# Patient Record
Sex: Female | Born: 1985 | Race: Black or African American | Hispanic: No | Marital: Single | Smoking: Never smoker
Health system: Southern US, Community
[De-identification: ages and names within clinical notes are randomized; demographics above are authoritative.]

## PROBLEM LIST (undated history)

## (undated) DIAGNOSIS — R002 Palpitations: Secondary | ICD-10-CM

## (undated) DIAGNOSIS — O24419 Gestational diabetes mellitus in pregnancy, unspecified control: Secondary | ICD-10-CM

## (undated) DIAGNOSIS — N83209 Unspecified ovarian cyst, unspecified side: Secondary | ICD-10-CM

## (undated) DIAGNOSIS — B9689 Other specified bacterial agents as the cause of diseases classified elsewhere: Secondary | ICD-10-CM

## (undated) DIAGNOSIS — N760 Acute vaginitis: Secondary | ICD-10-CM

## (undated) DIAGNOSIS — Z3490 Encounter for supervision of normal pregnancy, unspecified, unspecified trimester: Secondary | ICD-10-CM

## (undated) DIAGNOSIS — R87629 Unspecified abnormal cytological findings in specimens from vagina: Secondary | ICD-10-CM

## (undated) DIAGNOSIS — Z8619 Personal history of other infectious and parasitic diseases: Secondary | ICD-10-CM

## (undated) HISTORY — DX: Acute vaginitis: N76.0

## (undated) HISTORY — DX: Unspecified abnormal cytological findings in specimens from vagina: R87.629

## (undated) HISTORY — DX: Personal history of other infectious and parasitic diseases: Z86.19

## (undated) HISTORY — PX: APPENDECTOMY: SHX54

## (undated) HISTORY — DX: Unspecified ovarian cyst, unspecified side: N83.209

## (undated) HISTORY — DX: Other specified bacterial agents as the cause of diseases classified elsewhere: B96.89

## (undated) HISTORY — DX: Encounter for supervision of normal pregnancy, unspecified, unspecified trimester: Z34.90

## (undated) HISTORY — DX: Palpitations: R00.2

---

## 2007-04-17 ENCOUNTER — Emergency Department (HOSPITAL_COMMUNITY): Admission: EM | Admit: 2007-04-17 | Discharge: 2007-04-17 | Payer: Self-pay | Admitting: Emergency Medicine

## 2009-12-18 ENCOUNTER — Emergency Department (HOSPITAL_COMMUNITY): Admission: EM | Admit: 2009-12-18 | Discharge: 2009-12-18 | Payer: Self-pay | Admitting: Emergency Medicine

## 2010-02-25 ENCOUNTER — Encounter: Admission: RE | Admit: 2010-02-25 | Discharge: 2010-02-25 | Payer: Self-pay | Admitting: Obstetrics & Gynecology

## 2010-03-14 ENCOUNTER — Other Ambulatory Visit: Payer: Self-pay

## 2010-03-14 ENCOUNTER — Ambulatory Visit: Payer: Self-pay | Admitting: Advanced Practice Midwife

## 2010-03-14 ENCOUNTER — Other Ambulatory Visit: Payer: Self-pay | Admitting: Emergency Medicine

## 2010-03-14 ENCOUNTER — Inpatient Hospital Stay (HOSPITAL_COMMUNITY): Admission: AD | Admit: 2010-03-14 | Discharge: 2010-03-14 | Payer: Self-pay | Admitting: Obstetrics & Gynecology

## 2010-03-28 ENCOUNTER — Ambulatory Visit: Payer: Self-pay | Admitting: Obstetrics & Gynecology

## 2010-03-28 ENCOUNTER — Inpatient Hospital Stay (HOSPITAL_COMMUNITY): Admission: AD | Admit: 2010-03-28 | Discharge: 2010-04-01 | Payer: Self-pay | Admitting: Obstetrics & Gynecology

## 2010-03-29 ENCOUNTER — Encounter: Payer: Self-pay | Admitting: Obstetrics & Gynecology

## 2010-07-21 ENCOUNTER — Other Ambulatory Visit: Admission: RE | Admit: 2010-07-21 | Discharge: 2010-07-21 | Payer: Self-pay | Admitting: Obstetrics and Gynecology

## 2010-12-13 LAB — DIFFERENTIAL
Basophils Absolute: 0 10*3/uL (ref 0.0–0.1)
Basophils Relative: 0 % (ref 0–1)
Eosinophils Absolute: 0.1 10*3/uL (ref 0.0–0.7)
Monocytes Absolute: 0.7 10*3/uL (ref 0.1–1.0)
Neutro Abs: 4.8 10*3/uL (ref 1.7–7.7)
Neutrophils Relative %: 67 % (ref 43–77)

## 2010-12-13 LAB — URINALYSIS, ROUTINE W REFLEX MICROSCOPIC
Glucose, UA: NEGATIVE mg/dL
Specific Gravity, Urine: 1.022 (ref 1.005–1.030)
pH: 6 (ref 5.0–8.0)

## 2010-12-13 LAB — CBC
HCT: 35.7 % — ABNORMAL LOW (ref 36.0–46.0)
Hemoglobin: 9.7 g/dL — ABNORMAL LOW (ref 12.0–15.0)
MCHC: 34.7 g/dL (ref 30.0–36.0)
MCV: 87.3 fL (ref 78.0–100.0)
MCV: 87.6 fL (ref 78.0–100.0)
RBC: 3.12 MIL/uL — ABNORMAL LOW (ref 3.87–5.11)
RBC: 4.09 MIL/uL (ref 3.87–5.11)
RDW: 15.1 % (ref 11.5–15.5)
RDW: 15 % (ref 11.5–15.5)
WBC: 6.8 10*3/uL (ref 4.0–10.5)
WBC: 9.4 10*3/uL (ref 4.0–10.5)

## 2010-12-13 LAB — GLUCOSE, CAPILLARY: Glucose-Capillary: 127 mg/dL — ABNORMAL HIGH (ref 70–99)

## 2010-12-13 LAB — RPR: RPR Ser Ql: NONREACTIVE

## 2010-12-13 LAB — RUBELLA SCREEN: Rubella: 51.4 IU/mL — ABNORMAL HIGH

## 2010-12-13 LAB — URINE CULTURE

## 2010-12-13 LAB — URINE MICROSCOPIC-ADD ON

## 2011-07-12 LAB — RAPID STREP SCREEN (MED CTR MEBANE ONLY): Streptococcus, Group A Screen (Direct): NEGATIVE

## 2011-07-26 ENCOUNTER — Other Ambulatory Visit (HOSPITAL_COMMUNITY)
Admission: RE | Admit: 2011-07-26 | Discharge: 2011-07-26 | Disposition: A | Discharge status: Home / Self Care | Payer: Medicaid Other | Source: Ambulatory Visit | Attending: Obstetrics and Gynecology | Admitting: Obstetrics and Gynecology

## 2011-07-26 ENCOUNTER — Other Ambulatory Visit: Payer: Self-pay | Admitting: Adult Health

## 2011-07-26 DIAGNOSIS — Z01419 Encounter for gynecological examination (general) (routine) without abnormal findings: Secondary | ICD-10-CM | POA: Insufficient documentation

## 2011-07-26 DIAGNOSIS — Z113 Encounter for screening for infections with a predominantly sexual mode of transmission: Secondary | ICD-10-CM | POA: Insufficient documentation

## 2011-09-28 NOTE — L&D Delivery Note (Signed)
I have seen and examined this patient and I agree with the above. Abisai Coble 12:25 AM 09/02/2012    

## 2011-09-28 NOTE — L&D Delivery Note (Signed)
Delivery Note At 5:49 AM a viable female was delivered via Vaginal, Spontaneous Delivery (Presentation: ; Occiput Anterior).  APGAR: , 9; weight .   Placenta status: Intact, Spontaneous.  Cord: 3 vessels with the following complications:None  Anesthesia: Epidural  Episiotomy: None Est. Blood Loss (mL): 350  Mom to postpartum.  Baby to NICU.  Governor Specking 08/30/2012, 6:16 AM

## 2012-03-14 LAB — OB RESULTS CONSOLE ANTIBODY SCREEN: Antibody Screen: NEGATIVE

## 2012-03-14 LAB — OB RESULTS CONSOLE RPR: RPR: NONREACTIVE

## 2012-06-07 ENCOUNTER — Other Ambulatory Visit: Payer: Self-pay | Admitting: Obstetrics and Gynecology

## 2012-06-08 ENCOUNTER — Encounter (HOSPITAL_COMMUNITY): Payer: Self-pay | Admitting: Anesthesiology

## 2012-06-08 ENCOUNTER — Encounter (HOSPITAL_COMMUNITY)
Admission: RE | Disposition: A | Discharge status: Home / Self Care | Payer: Self-pay | Source: Ambulatory Visit | Attending: Obstetrics and Gynecology

## 2012-06-08 ENCOUNTER — Ambulatory Visit (HOSPITAL_COMMUNITY)
Admission: RE | Admit: 2012-06-08 | Discharge: 2012-06-08 | Disposition: A | Discharge status: Home / Self Care | Payer: Medicaid Other | Source: Ambulatory Visit | Attending: Obstetrics and Gynecology | Admitting: Obstetrics and Gynecology

## 2012-06-08 ENCOUNTER — Ambulatory Visit (HOSPITAL_COMMUNITY): Payer: Medicaid Other | Admitting: Anesthesiology

## 2012-06-08 ENCOUNTER — Encounter (HOSPITAL_COMMUNITY): Payer: Self-pay | Admitting: *Deleted

## 2012-06-08 ENCOUNTER — Encounter (HOSPITAL_COMMUNITY): Payer: Self-pay | Admitting: Obstetrics and Gynecology

## 2012-06-08 DIAGNOSIS — O3430 Maternal care for cervical incompetence, unspecified trimester: Secondary | ICD-10-CM | POA: Diagnosis present

## 2012-06-08 HISTORY — PX: CERVICAL CERCLAGE: SHX1329

## 2012-06-08 LAB — CBC
HCT: 34.3 % — ABNORMAL LOW (ref 36.0–46.0)
Hemoglobin: 11.7 g/dL — ABNORMAL LOW (ref 12.0–15.0)
MCHC: 34.1 g/dL (ref 30.0–36.0)
MCV: 87.5 fL (ref 78.0–100.0)
RDW: 12.9 % (ref 11.5–15.5)

## 2012-06-08 SURGERY — CERCLAGE, CERVIX, VAGINAL APPROACH
Anesthesia: Spinal | Site: Cervix | Wound class: Clean Contaminated

## 2012-06-08 MED ORDER — BUPIVACAINE IN DEXTROSE 0.75-8.25 % IT SOLN
INTRATHECAL | Status: DC | PRN
  Administered 2012-06-08: 1.4 mL via INTRATHECAL

## 2012-06-08 MED ORDER — LACTATED RINGERS IV SOLN
INTRAVENOUS | Status: DC
  Administered 2012-06-08: 13:00:00 via INTRAVENOUS

## 2012-06-08 MED ORDER — MEPERIDINE HCL 25 MG/ML IJ SOLN: 6.2500 mg | INTRAMUSCULAR | Status: DC | PRN

## 2012-06-08 MED ORDER — ONDANSETRON HCL 4 MG/2ML IJ SOLN
INTRAMUSCULAR | Status: DC | PRN
  Administered 2012-06-08: 4 mg via INTRAVENOUS

## 2012-06-08 MED ORDER — FENTANYL CITRATE 0.05 MG/ML IJ SOLN
INTRAMUSCULAR | Status: AC
  Filled 2012-06-08: qty 2

## 2012-06-08 MED ORDER — ONDANSETRON HCL 4 MG/2ML IJ SOLN: 4.0000 mg | Freq: Once | INTRAMUSCULAR | Status: DC | PRN

## 2012-06-08 MED ORDER — KETOROLAC TROMETHAMINE 30 MG/ML IJ SOLN
15.0000 mg | Freq: Once | INTRAMUSCULAR | Status: AC | PRN
  Administered 2012-06-08: 30 mg via INTRAVENOUS

## 2012-06-08 MED ORDER — FENTANYL CITRATE 0.05 MG/ML IJ SOLN: 25.0000 ug | INTRAMUSCULAR | Status: DC | PRN

## 2012-06-08 MED ORDER — FENTANYL CITRATE 0.05 MG/ML IJ SOLN
INTRAMUSCULAR | Status: DC | PRN
  Administered 2012-06-08 (×2): 50 ug via INTRAVENOUS

## 2012-06-08 MED ORDER — CEFAZOLIN SODIUM-DEXTROSE 2-3 GM-% IV SOLR
2.0000 g | INTRAVENOUS | Status: AC
  Administered 2012-06-08: 2 g via INTRAVENOUS

## 2012-06-08 MED ORDER — ONDANSETRON HCL 4 MG/2ML IJ SOLN
INTRAMUSCULAR | Status: AC
  Filled 2012-06-08: qty 2

## 2012-06-08 MED ORDER — NIFEDIPINE 10 MG PO CAPS
10.0000 mg | ORAL_CAPSULE | Freq: Once | ORAL | Status: AC
  Administered 2012-06-08: 10 mg via ORAL
  Filled 2012-06-08: qty 1

## 2012-06-08 MED ORDER — CEFAZOLIN SODIUM-DEXTROSE 2-3 GM-% IV SOLR
INTRAVENOUS | Status: AC
  Filled 2012-06-08: qty 50

## 2012-06-08 SURGICAL SUPPLY — 18 items
CATH ROBINSON RED A/P 16FR (CATHETERS) ×2 IMPLANT
CLOTH BEACON ORANGE TIMEOUT ST (SAFETY) ×2 IMPLANT
COUNTER NEEDLE 1200 MAGNETIC (NEEDLE) ×2 IMPLANT
GLOVE BIO SURGEON ST LM GN SZ9 (GLOVE) ×2 IMPLANT
GLOVE BIO SURGEON STRL SZ7 (GLOVE) ×2 IMPLANT
GLOVE BIOGEL PI IND STRL 9 (GLOVE) ×2 IMPLANT
GLOVE BIOGEL PI INDICATOR 9 (GLOVE) ×2
GOWN PREVENTION PLUS LG XLONG (DISPOSABLE) ×2 IMPLANT
GOWN PREVENTION PLUS XLARGE (GOWN DISPOSABLE) ×2 IMPLANT
GOWN STRL REIN 3XL LVL4 (GOWN DISPOSABLE) ×2 IMPLANT
PACK VAGINAL MINOR WOMEN LF (CUSTOM PROCEDURE TRAY) ×2 IMPLANT
PAD OB MATERNITY 4.3X12.25 (PERSONAL CARE ITEMS) ×2 IMPLANT
PAD PREP 24X48 CUFFED NSTRL (MISCELLANEOUS) ×2 IMPLANT
SUT PROLENE 1 CT 1 30 (SUTURE) ×2 IMPLANT
TOWEL OR 17X24 6PK STRL BLUE (TOWEL DISPOSABLE) ×4 IMPLANT
TUBING NON-CON 1/4 X 20 CONN (TUBING) ×2 IMPLANT
WATER STERILE IRR 1000ML POUR (IV SOLUTION) ×2 IMPLANT
YANKAUER SUCT BULB TIP NO VENT (SUCTIONS) ×2 IMPLANT

## 2012-06-08 NOTE — Op Note (Signed)
See the operative details included in the brief Op note

## 2012-06-08 NOTE — Anesthesia Procedure Notes (Signed)
Spinal  Patient location during procedure: OR Start time: 06/08/2012 1:50 PM End time: 06/08/2012 1:55 PM Staffing Anesthesiologist: Sandrea Hughs Performed by: anesthesiologist  Preanesthetic Checklist Completed: patient identified, site marked, surgical consent, pre-op evaluation, timeout performed, IV checked, risks and benefits discussed and monitors and equipment checked Spinal Block Patient position: sitting Prep: DuraPrep Patient monitoring: heart rate, cardiac monitor, continuous pulse ox and blood pressure Approach: midline Location: L3-4 Injection technique: single-shot Needle Needle type: Sprotte  Needle gauge: 24 G Needle length: 9 cm Needle insertion depth: 8 cm Assessment Sensory level: T4

## 2012-06-08 NOTE — Transfer of Care (Signed)
Immediate Anesthesia Transfer of Care Note  Patient: Briana Nichols  Procedure(s) Performed: Procedure(s) (LRB) with comments: CERCLAGE CERVICAL (N/A)  Patient Location: PACU  Anesthesia Type: Spinal  Level of Consciousness: awake, alert  and oriented  Airway & Oxygen Therapy: Patient Spontanous Breathing  Post-op Assessment: Report given to PACU RN and Post -op Vital signs reviewed and stable  Post vital signs: Reviewed and stable  Complications: No apparent anesthesia complications

## 2012-06-08 NOTE — Progress Notes (Signed)
Patient requesting to get up. Patient stood and ambulated with assistance. Walked to bathroom, voided, assisted with getting patient dressed. Walked back to wheelchair with minimal assistance. Patient feeling much better about going home. Reassured.

## 2012-06-08 NOTE — Brief Op Note (Signed)
06/08/2012  2:23 PM  PATIENT:  Briana Nichols  26 y.o. female  PRE-OPERATIVE DIAGNOSIS:  Cervical Incompetence, pregnancy 21+6 weeks  POST-OPERATIVE DIAGNOSIS:  Cervical Incompetence same  PROCEDURE:  Procedure(s) (LRB) with comments: CERCLAGE CERVICAL (N/A)  SURGEON:  Surgeon(s) and Role:    * Tilda Burrow, MD - Primary  PHYSICIAN ASSISTANT:   ASSISTANTS: none   ANESTHESIA:   spinal  EBL:  Total I/O In: 800 [I.V.:800] Out: -   BLOOD ADMINISTERED:none  DRAINS: none   LOCAL MEDICATIONS USED:  NONE  SPECIMEN:  No Specimen  DISPOSITION OF SPECIMEN:  N/A  COUNTS:  YES  TOURNIQUET:  * No tourniquets in log *  DICTATION: .Dragon Dictation Patient was taken operating room prepped and draped for vaginal procedure after spinal anesthesia introduced. Timeout was confirmed by operative team Ancef 1 g IV was administered. Timeout was followed by placement of a open sided speculum,. Cervix was visibly unchanged from yesterday's exam. And there was some reasonable cervical length protruding into the vagina.. The #1 early in suture was begun at 12:00, and a very superficial circumferential pursestring suture of #1 Prolene was placed in a clockwise fashion around the cervix at the level of its insertion into the vaginal wall. Once circumferential suturing was completed a surgical knot was placed and tied down such that a standard ring forceps could be passed through the cervix with some resistance encountered. There was no bleeding from the endocervix throughout the procedure, only some small amount of bleeding from the suture sites themselves which, which stopped promptly upon completion of procedure the blood type is confirmed as Rh+ by the review of old records. Patient will go recovery room be discharged home stable condition thereafter. PLAN OF CARE: Discharge to home after PACU  PATIENT DISPOSITION:  PACU - hemodynamically stable.   Delay start of Pharmacological VTE agent  (>24hrs) due to surgical blood loss or risk of bleeding: not applicable

## 2012-06-08 NOTE — Progress Notes (Signed)
Patient stood at recliner with assistance, took a few steps. Still feels she is not ready to walk. States " feet still feel numb on the bottom". Patient given coke and peanut butter with crackers. Will reassess later.

## 2012-06-08 NOTE — Anesthesia Preprocedure Evaluation (Signed)
Anesthesia Evaluation  Patient identified by MRN, date of birth, ID band Patient awake    Reviewed: Allergy & Precautions, H&P , NPO status , Patient's Chart, lab work & pertinent test results  Airway Mallampati: II TM Distance: >3 FB Neck ROM: full    Dental No notable dental hx.    Pulmonary neg pulmonary ROS,    Pulmonary exam normal       Cardiovascular negative cardio ROS      Neuro/Psych negative neurological ROS  negative psych ROS   GI/Hepatic negative GI ROS, Neg liver ROS,   Endo/Other  Morbid obesity  Renal/GU negative Renal ROS  negative genitourinary   Musculoskeletal negative musculoskeletal ROS (+)   Abdominal (+) + obese,   Peds negative pediatric ROS (+)  Hematology negative hematology ROS (+)   Anesthesia Other Findings   Reproductive/Obstetrics (+) Pregnancy                           Anesthesia Physical Anesthesia Plan  ASA: III  Anesthesia Plan: Spinal   Post-op Pain Management:    Induction:   Airway Management Planned:   Additional Equipment:   Intra-op Plan:   Post-operative Plan:   Informed Consent: I have reviewed the patients History and Physical, chart, labs and discussed the procedure including the risks, benefits and alternatives for the proposed anesthesia with the patient or authorized representative who has indicated his/her understanding and acceptance.     Plan Discussed with:   Anesthesia Plan Comments:         Anesthesia Quick Evaluation

## 2012-06-08 NOTE — Anesthesia Postprocedure Evaluation (Signed)
  Anesthesia Post-op Note  Patient: Briana Nichols  Procedure(s) Performed: Procedure(s) (LRB) with comments: CERCLAGE CERVICAL (N/A)  Patient Location: PACU  Anesthesia Type: Spinal  Level of Consciousness: awake, alert  and oriented  Airway and Oxygen Therapy: Patient Spontanous Breathing  Post-op Pain: none  Post-op Assessment: Post-op Vital signs reviewed, Patient's Cardiovascular Status Stable, Respiratory Function Stable, Patent Airway, No signs of Nausea or vomiting, Pain level controlled, No headache and No backache  Post-op Vital Signs: Reviewed and stable  Complications: No apparent anesthesia complications

## 2012-06-08 NOTE — Progress Notes (Signed)
Patient in phase II. Patient was spinal. Patient does not feel like she can stand. Reassured patient. Stood at recliner for few minutes but unable to take a step.

## 2012-06-08 NOTE — H&P (Signed)
Briana Nichols is an 26 y.o. female. She is scheduled for McDonald cerclage at 1:30 PM 06/08/2012 due to progression of cervical dilation and funneling despite Prometrium transvaginally 200 mg daily. OB history as of a term pregnancy delivered by cesarean section for failure to progress and 39 weeks at completely dilated. This there is no predisposing factors in history, no cervical therapies and specifically no cryocautery or conization procedures.  Pertinent Gynecology Previous GYN Procedures: Cesarean section low transverse  Last mammogram: Not applicable Date:  Last pap: normal Date: 07/27/2011 OB History: G3, P1011   Menstrual History: Menarche age:  No LMP recorded.    No past medical history on file.  No past surgical history on file. [facility]  No family history on file.  Social History:  does not have a smoking history on file. She does not have any smokeless tobacco history on file. Her alcohol and drug histories not on file.  Allergies: Allergies not on file  No prescriptions prior to admission    Review of Systems  Constitutional: Negative.     There were no vitals taken for this visit. height 5 feet 6 weight 264.8 pounds blood pressure 110/60 urinalysis negative Physical Exam  Constitutional: She appears well-developed and well-nourished.  HENT:  Head: Normocephalic and atraumatic.  Eyes: Pupils are equal, round, and reactive to light.  Neck: Neck supple. No thyromegaly present.  Cardiovascular: Normal rate and regular rhythm.   Respiratory: Effort normal.  GI: Soft.  Genitourinary: Vagina normal.       Transvaginal ultrasound performed on June 07 2012 shows the cervix has changed to 0.4 cm of closed cervical length, with funneling measuring 1.3 cm maximum width, narrowing the 0.5 cm total funneling length 1.8 cm. This is progression of the funneling since last weeks ultrasound where the cervical length was  1.3-1.7 cm on various measurements   speculum exam shows the cervix to be high well supported and visibly normal  No results found for this or any previous visit (from the past 24 hour(s)).  No results found.  Assessment/Plan: Pregnancy 21 weeks 6 days progressive cervical incompetency despite Prometrium therapy, scheduled for McDonald cerclage.. Risks of procedure including potential for amniotic membrane rupture, and technical failure or continued progression of cervical dilation  Puccio,Caryl Fate V 06/08/2012, 9:21 AM

## 2012-06-09 ENCOUNTER — Encounter (HOSPITAL_COMMUNITY): Payer: Self-pay | Admitting: Obstetrics and Gynecology

## 2012-08-18 ENCOUNTER — Inpatient Hospital Stay (HOSPITAL_COMMUNITY)
Admission: RE | Admit: 2012-08-18 | Discharge: 2012-09-01 | DRG: 775 | Disposition: A | Discharge status: Home / Self Care | Payer: Medicaid Other | Source: Ambulatory Visit | Attending: Obstetrics & Gynecology | Admitting: Obstetrics & Gynecology

## 2012-08-18 DIAGNOSIS — O4290 Premature rupture of membranes, unspecified as to length of time between rupture and onset of labor, unspecified weeks of gestation: Secondary | ICD-10-CM | POA: Diagnosis present

## 2012-08-18 DIAGNOSIS — O3430 Maternal care for cervical incompetence, unspecified trimester: Principal | ICD-10-CM | POA: Diagnosis present

## 2012-08-18 DIAGNOSIS — O42913 Preterm premature rupture of membranes, unspecified as to length of time between rupture and onset of labor, third trimester: Secondary | ICD-10-CM | POA: Diagnosis present

## 2012-08-18 HISTORY — DX: Gestational diabetes mellitus in pregnancy, unspecified control: O24.419

## 2012-08-19 ENCOUNTER — Inpatient Hospital Stay (HOSPITAL_COMMUNITY): Payer: Medicaid Other

## 2012-08-19 ENCOUNTER — Encounter (HOSPITAL_COMMUNITY): Payer: Self-pay | Admitting: *Deleted

## 2012-08-19 LAB — PREPARE RBC (CROSSMATCH)

## 2012-08-19 MED ORDER — MAGNESIUM SULFATE 40 G IN LACTATED RINGERS - SIMPLE
2.0000 g/h | INTRAVENOUS | Status: DC
  Administered 2012-08-19: 2 g/h via INTRAVENOUS
  Filled 2012-08-19 (×2): qty 500

## 2012-08-19 MED ORDER — ERYTHROMYCIN BASE 250 MG PO TABS
250.0000 mg | ORAL_TABLET | Freq: Four times a day (QID) | ORAL | Status: AC
  Administered 2012-08-21 – 2012-08-25 (×20): 250 mg via ORAL
  Filled 2012-08-19 (×20): qty 1

## 2012-08-19 MED ORDER — PRENATAL MULTIVITAMIN CH
1.0000 | ORAL_TABLET | Freq: Every day | ORAL | Status: DC
  Administered 2012-08-19 – 2012-08-29 (×11): 1 via ORAL
  Filled 2012-08-19 (×11): qty 1

## 2012-08-19 MED ORDER — SODIUM CHLORIDE 0.9 % IV SOLN
250.0000 mg | Freq: Four times a day (QID) | INTRAVENOUS | Status: AC
  Administered 2012-08-19 – 2012-08-20 (×8): 250 mg via INTRAVENOUS
  Filled 2012-08-19 (×8): qty 250

## 2012-08-19 MED ORDER — CALCIUM CARBONATE ANTACID 500 MG PO CHEW
2.0000 | CHEWABLE_TABLET | ORAL | Status: DC | PRN
  Administered 2012-08-22: 400 mg via ORAL
  Filled 2012-08-19: qty 2

## 2012-08-19 MED ORDER — SODIUM CHLORIDE 0.9 % IV SOLN
2.0000 g | Freq: Four times a day (QID) | INTRAVENOUS | Status: AC
  Administered 2012-08-19 – 2012-08-20 (×8): 2 g via INTRAVENOUS
  Filled 2012-08-19 (×8): qty 2000

## 2012-08-19 MED ORDER — DOCUSATE SODIUM 100 MG PO CAPS
100.0000 mg | ORAL_CAPSULE | Freq: Every day | ORAL | Status: DC
  Administered 2012-08-19 – 2012-08-29 (×11): 100 mg via ORAL
  Filled 2012-08-19 (×11): qty 1

## 2012-08-19 MED ORDER — ZOLPIDEM TARTRATE 5 MG PO TABS
5.0000 mg | ORAL_TABLET | Freq: Every evening | ORAL | Status: DC | PRN
  Administered 2012-08-24 – 2012-08-29 (×7): 5 mg via ORAL
  Filled 2012-08-19 (×7): qty 1

## 2012-08-19 MED ORDER — LACTATED RINGERS IV SOLN
INTRAVENOUS | Status: DC
  Administered 2012-08-19 – 2012-08-20 (×2): via INTRAVENOUS

## 2012-08-19 MED ORDER — ACETAMINOPHEN 325 MG PO TABS
650.0000 mg | ORAL_TABLET | ORAL | Status: DC | PRN
  Administered 2012-08-29: 650 mg via ORAL
  Filled 2012-08-19: qty 2

## 2012-08-19 MED ORDER — AMOXICILLIN 500 MG PO CAPS
500.0000 mg | ORAL_CAPSULE | Freq: Three times a day (TID) | ORAL | Status: AC
  Administered 2012-08-21 – 2012-08-25 (×15): 500 mg via ORAL
  Filled 2012-08-19 (×15): qty 1

## 2012-08-19 MED ORDER — BETAMETHASONE SOD PHOS & ACET 6 (3-3) MG/ML IJ SUSP
12.0000 mg | Freq: Once | INTRAMUSCULAR | Status: AC
  Administered 2012-08-19: 12 mg via INTRAMUSCULAR
  Filled 2012-08-19: qty 2

## 2012-08-19 NOTE — H&P (Signed)
Attestation of Attending Supervision of Advanced Practitioner (CNM/NP): Evaluation and management procedures were performed by the Advanced Practitioner under my supervision and collaboration.  I have reviewed the Advanced Practitioner's note and chart, and I agree with the management and plan.  Patient has labs pending at [facility]; CBC, UA, U C/S, GC/Chlam. No record of GBS being done.  If she is undelivered after her latency antibiotics, she needs a GBS PCR or she will need to be treated with antibiotics if she goes into labor. OB Limited Ultrasound here showed AFI 13.39, cephalic presentation. Cervix 3.18 cm, appears closed.  Will schedule growth scan on 08/10/12.  Continue routine antenatal care.  Jaynie Collins, MD, FACOG Attending Obstetrician & Gynecologist Faculty Practice, [facility]

## 2012-08-19 NOTE — H&P (Signed)
Briana Nichols is a 26 y.o. female presenting for rupture of membranes. Pt was taken via EMS to a hospital in [place], [place]. She had a +amnisure. Ultrasound there showed the baby to be vertex. No other ultrasound evaluation done. Pt was started on magnesium and give 4 gram load and 2 grams/hour. She was given 1 dose of BMZ, and Ampicillin and Zithromax. ER doctor consulted with Dr. Macon Large, and patient was brought here via EMS. On arrival she is complaining of intermittent LLQ pain.  Maternal Medical History:  Reason for admission: Reason for admission: rupture of membranes.  Reason for Admission:   nauseaFetal activity: Perceived fetal activity is normal.   Last perceived fetal movement was within the past hour.      OB History    Grav Para Term Preterm Abortions TAB SAB Ect Mult Living   4 1   1  1         No past medical history on file. Past Surgical History  Procedure Date  . Cesarean section 2011  . Cervical cerclage 06/08/2012    Procedure: CERCLAGE CERVICAL;  Surgeon: Tilda Burrow, MD;  Location: [facility];  Service: Gynecology;  Laterality: N/A;   Family History: family history is not on file. Social History:  reports that she has never smoked. She has never used smokeless tobacco. She reports that she does not drink alcohol or use illicit drugs.   Prenatal Transfer Tool  Maternal Diabetes: No Genetic Screening: Normal Maternal Ultrasounds/Referrals: Normal fetal ultrasound. Shortened cervical length. Last cervical length 1/2 Cervix was 7mm Fetal Ultrasounds or other Referrals:  None Maternal Substance Abuse:  No Significant Maternal Medications:  None Significant Maternal Lab Results:  None Other Comments:  None  Review of Systems  Constitutional: Negative for fever and chills.  Eyes: Negative for blurred vision.  Gastrointestinal: Positive for abdominal pain (LLQ pain). Negative for nausea, vomiting, diarrhea and constipation.  Genitourinary: Negative for dysuria,  urgency and frequency.  Musculoskeletal: Negative for myalgias.  Neurological: Negative for headaches.      There were no vitals taken for this visit. Maternal Exam:  Uterine Assessment: Contraction strength is mild.  Contraction duration is 30 seconds. Contraction frequency is irregular.   Abdomen: Surgical scars: low transverse.   Fetal presentation: vertex  Introitus: not evaluated.     Fetal Exam Fetal Monitor Review: Mode: ultrasound.   Baseline rate: 140.  Variability: moderate (6-25 bpm).   Pattern: accelerations present.    Fetal State Assessment: Category I - tracings are normal.     Physical Exam  Nursing note and vitals reviewed. Constitutional: She is oriented to person, place, and time. She appears well-developed and well-nourished.  Cardiovascular: Normal rate.   GI: Soft. There is no tenderness.       Abdomen is very soft, and the irregular contractions that she has palpate very mild.   Neurological: She is alert and oriented to person, place, and time.  Skin: Skin is warm and dry.    Prenatal labs: ABO, Rh:  A positive Antibody:  Negative Rubella:  Immune RPR:   NR HBsAg:   Negative HIV:   Negative GBS:   Negative  Assessment/Plan: PPROM at 33 2/7 Reassuring M-F status Expectant management BMZ Ampicillin, Erythro Magnesium   Tawnya Crook 08/19/2012, 12:25 AM

## 2012-08-20 MED ORDER — LACTATED RINGERS IV SOLN: INTRAVENOUS | Status: DC

## 2012-08-20 NOTE — Plan of Care (Signed)
Problem: Consults Goal: Birthing Suites Patient Information Press F2 to bring up selections list   Pt < [redacted] weeks EGA     

## 2012-08-20 NOTE — Progress Notes (Signed)
IV restarted in left hand with 18 g cath. IV  Was located in left antecubital space - "occluded". Pt. Unable to keep arm straight, IV pump alarm every 15 minutes for the past hour with "pt. Side occluded". Pt. In agreement with restarting IV.

## 2012-08-20 NOTE — Progress Notes (Signed)
0730- magnesium sulfate dc'd.

## 2012-08-20 NOTE — Progress Notes (Signed)
Patient ID: Briana Nichols, female   DOB: March 06, 1986, 26 y.o.   MRN: [id number] FACULTY PRACTICE ANTEPARTUM(COMPREHENSIVE) NOTE  Dela Sweeny is a 26 y.o. G3P1011 at [redacted]w[redacted]d by best clinical estimate who is admitted for PROM.   Fetal presentation is cephalic. Length of Stay:  2  Days  Subjective: No contractions Patient reports the fetal movement as active. Patient reports uterine contraction  activity as none. Patient reports  vaginal bleeding as none. Patient describes fluid per vagina as Clear.  Vitals:  Blood pressure 106/51, pulse 99, temperature 98.2 F (36.8 C), temperature source Oral, resp. rate 20, height 5\' 6"  (1.676 m), weight 265 lb (120.203 kg). Physical Examination:  General appearance - alert, well appearing, and in no distress Heart - normal rate and regular rhythm Abdomen - soft, nontender, nondistended Fundal Height:  size equals dates Cervical Exam: Not evaluated.  Extremities: extremities normal, atraumatic, no cyanosis or edema and Homans sign is negative, no sign of DVT with DTRs 2+ bilaterally Membranes:ruptured  Fetal Monitoring:  Baseline: 140 bpm, Variability: Good {> 6 bpm) and Accelerations: Reactive  Labs:  Recent Results (from the past 24 hour(s))  TYPE AND SCREEN   Collection Time   08/19/12  8:09 AM      Component Value Range   ABO/RH(D) A POS     Antibody Screen NEG     Sample Expiration 08/22/2012     Unit Number [id number]     Blood Component Type RED CELLS,LR     Unit division 00     Status of Unit ALLOCATED     Transfusion Status OK TO TRANSFUSE     Crossmatch Result Compatible     Unit Number [id number]     Blood Component Type RED CELLS,LR     Unit division 00     Status of Unit ALLOCATED     Transfusion Status OK TO TRANSFUSE     Crossmatch Result Compatible    ABO/RH   Collection Time   08/19/12  8:11 AM      Component Value Range   ABO/RH(D) A POS     CBC    Component Value Date/Time   WBC 7.3 06/08/2012 1238   RBC 3.92 06/08/2012 1238   HGB 11.7* 06/08/2012 1238   HCT 34.3* 06/08/2012 1238   PLT 153 06/08/2012 1238   MCV 87.5 06/08/2012 1238   MCH 29.8 06/08/2012 1238   MCHC 34.1 06/08/2012 1238   RDW 12.9 06/08/2012 1238   LYMPHSABS 1.6 03/14/2010 1725   MONOABS 0.7 03/14/2010 1725   EOSABS 0.1 03/14/2010 1725   BASOSABS 0.0 03/14/2010 1725      Imaging Studies:    *RADIOLOGY REPORT*  Clinical Data: Cervical insufficiency. Cerclage. Premature  rupture of membranes. Assigned gestational age is 32 weeks 2 days.  LIMITED OBSTETRIC ULTRASOUND  Number of Fetuses: 1  Heart Rate: 130 to bpm  Movement: Fetal movement is identified.  Presentation: Fetus is in cephalic presentation.  Placental Location: The placenta is posterior.  Previa: No previa.  Amniotic Fluid (Subjective): Normal.  Vertical pocket: 5.74cm AFI: 13.39 cm (5%ile 8.6 cm, 95%ile  24.2 cm)  BPD: 8.27cm, 32 weeks 2 days EDC: 10/05/2012  MATERNAL FINDINGS:  Cervix: Cervical length measures 3.18 cm but is not well  visualized. Cervix appears closed.  Uterus/Adnexae: Limited visualization. No discrete abnormality  identified.  IMPRESSION:  Single intrauterine pregnancy. No specific complication  identified. Cervix appears closed and amniotic fluid volume  appears normal.  Recommend followup with non-emergent complete  OB 14+ wk US  examination for fetal biometric evaluation and anatomic survey if  not already performed.  Original Report Authenticated By: Burman Nieves, M.D.    Medications:  Scheduled    . ampicillin (OMNIPEN) IV  2 g Intravenous Q6H   Followed by  . amoxicillin  500 mg Oral Q8H  . [COMPLETED] betamethasone acetate-betamethasone sodium phosphate  12 mg Intramuscular Once  . docusate sodium  100 mg Oral Daily  . erythromycin  250 mg Intravenous Q6H   Followed by  . erythromycin  250 mg Oral Q6H  . prenatal multivitamin  1 tablet Oral Daily   I have reviewed the patient's current  medications.  ASSESSMENT: Patient Active Problem List  Diagnosis  . Cervical incompetence affecting management of pregnancy, antepartum  Preterm rupture of membranes  PLAN: D/C Magnesium.  Mariateresa Batra 08/20/2012,6:30 AM

## 2012-08-21 ENCOUNTER — Inpatient Hospital Stay (HOSPITAL_COMMUNITY): Payer: Medicaid Other

## 2012-08-21 DIAGNOSIS — O3430 Maternal care for cervical incompetence, unspecified trimester: Secondary | ICD-10-CM

## 2012-08-21 DIAGNOSIS — O4290 Premature rupture of membranes, unspecified as to length of time between rupture and onset of labor, unspecified weeks of gestation: Secondary | ICD-10-CM

## 2012-08-21 NOTE — Progress Notes (Signed)
Ur chart review completed.  

## 2012-08-21 NOTE — Progress Notes (Signed)
Patient ID: Briana Nichols, female   DOB: 1986-06-10, 26 y.o.   MRN: [id number] Patient ID: Briana Nichols, female DOB: [birth year], 26 y.o. MRN: [id number]  FACULTY PRACTICE ANTEPARTUM(COMPREHENSIVE) NOTE  Briana Nichols is a 26 y.o. G3P1011 at [redacted]w[redacted]d by best clinical estimate who is admitted for PROM.  Fetal presentation is cephalic.  Length of Stay: 2 Days  Subjective:  No contractions  Patient reports the fetal movement as active.  Patient reports uterine contraction activity as none.  Patient reports vaginal bleeding as none.  Patient describes fluid per vagina as Clear.  Vitals: Blood pressure 106/51, pulse 99, temperature 98.2 F (36.8 C), temperature source Oral, resp. rate 20, height 5\' 6"  (1.676 m), weight 265 lb (120.203 kg).  Physical Examination:  General appearance - alert, well appearing, and in no distress  Heart - normal rate and regular rhythm  Abdomen - soft, nontender, nondistended  Fundal Height: size equals dates  Cervical Exam: Not evaluated.  Extremities: extremities normal, atraumatic, no cyanosis or edema and Homans sign is negative, no sign of DVT with DTRs 2+ bilaterally  Membranes:ruptured  Fetal Monitoring: Baseline: 140 bpm, Variability: Good {> 6 bpm) and Accelerations: Reactive  Labs:  Recent Results (from the past 24 hour(s))   TYPE AND SCREEN    Collection Time    08/19/12 8:09 AM   Component  Value  Range    ABO/RH(D)  A POS     Antibody Screen  NEG     Sample Expiration  08/22/2012     Unit Number  [id number]     Blood Component Type  RED CELLS,LR     Unit division  00     Status of Unit  ALLOCATED     Transfusion Status  OK TO TRANSFUSE     Crossmatch Result  Compatible     Unit Number  [id number]     Blood Component Type  RED CELLS,LR     Unit division  00     Status of Unit  ALLOCATED     Transfusion Status  OK TO TRANSFUSE     Crossmatch Result  Compatible    ABO/RH    Collection Time    08/19/12 8:11 AM   Component  Value   Range    ABO/RH(D)  A POS    CBC    Component  Value  Date/Time    WBC  7.3  06/08/2012 1238    RBC  3.92  06/08/2012 1238    HGB  11.7*  06/08/2012 1238    HCT  34.3*  06/08/2012 1238    PLT  153  06/08/2012 1238    MCV  87.5  06/08/2012 1238    MCH  29.8  06/08/2012 1238    MCHC  34.1  06/08/2012 1238    RDW  12.9  06/08/2012 1238    LYMPHSABS  1.6  03/14/2010 1725    MONOABS  0.7  03/14/2010 1725    EOSABS  0.1  03/14/2010 1725    BASOSABS  0.0  03/14/2010 1725   Imaging Studies:  *RADIOLOGY REPORT*  Clinical Data: Cervical insufficiency. Cerclage. Premature  rupture of membranes. Assigned gestational age is 32 weeks 2 days.  LIMITED OBSTETRIC ULTRASOUND  Number of Fetuses: 1  Heart Rate: 130 to bpm  Movement: Fetal movement is identified.  Presentation: Fetus is in cephalic presentation.  Placental Location: The placenta is posterior.  Previa: No previa.  Amniotic Fluid (Subjective): Normal.  Vertical pocket: 5.74cm AFI: 13.39 cm (  5%ile 8.6 cm, 95%ile  24.2 cm)  BPD: 8.27cm, 32 weeks 2 days EDC: 10/05/2012  MATERNAL FINDINGS:  Cervix: Cervical length measures 3.18 cm but is not well  visualized. Cervix appears closed.  Uterus/Adnexae: Limited visualization. No discrete abnormality  identified.  IMPRESSION:  Single intrauterine pregnancy. No specific complication  identified. Cervix appears closed and amniotic fluid volume  appears normal.  Recommend followup with non-emergent complete OB 14+ wk US  examination for fetal biometric evaluation and anatomic survey if  not already performed.  Original Report Authenticated By: Burman Nieves, M.D.  Medications: Scheduled  .  ampicillin (OMNIPEN) IV  2 g  Intravenous  Q6H    Followed by   .  amoxicillin  500 mg  Oral  Q8H   .  [COMPLETED] betamethasone acetate-betamethasone sodium phosphate  12 mg  Intramuscular  Once   .  docusate sodium  100 mg  Oral  Daily   .  erythromycin  250 mg  Intravenous  Q6H    Followed by   .   erythromycin  250 mg  Oral  Q6H   .  prenatal multivitamin  1 tablet  Oral  Daily   I have reviewed the patient's current medications.  ASSESSMENT:  Patient Active Problem List   Diagnosis   .  Cervical incompetence affecting management of pregnancy, antepartum   Preterm rupture of membranes  PLAN: Delivery at 34 weeks or for s/sx of chorioamnionitis

## 2012-08-22 DIAGNOSIS — O42913 Preterm premature rupture of membranes, unspecified as to length of time between rupture and onset of labor, third trimester: Secondary | ICD-10-CM | POA: Diagnosis present

## 2012-08-22 LAB — TYPE AND SCREEN: Antibody Screen: NEGATIVE

## 2012-08-22 LAB — CBC
HCT: 32.3 % — ABNORMAL LOW (ref 36.0–46.0)
Hemoglobin: 10.8 g/dL — ABNORMAL LOW (ref 12.0–15.0)
RBC: 3.73 MIL/uL — ABNORMAL LOW (ref 3.87–5.11)
WBC: 9.6 10*3/uL (ref 4.0–10.5)

## 2012-08-22 MED ORDER — SODIUM CHLORIDE 0.9 % IJ SOLN
3.0000 mL | Freq: Two times a day (BID) | INTRAMUSCULAR | Status: DC
  Administered 2012-08-22 – 2012-08-23 (×5): 3 mL via INTRAVENOUS

## 2012-08-22 NOTE — Consult Note (Signed)
Antenatal Consult:  Asked by Dr Macon Large to speak to Mrs Boudreau to discuss outcome of preterm delivery at 2 5/[redacted] weeks gestation. Chart reviewed. Briefly, she is a 26 y/o G3P1 admitted since 11/22 for PPROM, with rare contractions. Fetal US shows normal fetal growth, low-normal AFI.  She received a course of betamethasone, was on magnesium sulfate and started on Ampicillin, Erythromycin and Zithromax.   EFW about 4 lbs per latest fetal sonogram.   I spoke to Mrs Marston in Room 156 and discussed varied preterm outcomes in an infant born between 32 weeks and close to [redacted] weeks gestation. Discussed complications of of earlier gestation such as RDS, IVH, sepsis, immaturity of GI tract and immune system. Discussed treatment and respiratory support as well as temperature support, gavage feeding, and LOS. I also discussed benefits of breastfeeding. Mrs Hagens plans to breast feed and is quite interested in doing skin to skin.  She asked appropriate questions which I answered.    Thank you for this consult.     Overton Mam, MD (Attending Neonatologist)   Consult time:   30 minutes

## 2012-08-22 NOTE — Progress Notes (Signed)
  FACULTY PRACTICE ANTEPARTUM(COMPREHENSIVE) NOTE  Briana Nichols is a 26 y.o. G3P1011 at [redacted]w[redacted]d by best clinical estimate who is admitted for PROM.  Fetal presentation is cephalic.  Length of Stay: 2 Days  Subjective:  No contractions  Patient reports the fetal movement as active.  Patient reports uterine contraction activity as none.  Patient reports vaginal bleeding as none.  Patient describes fluid per vagina as Clear.  Vitals: Blood pressure 114/59, pulse 89, temperature 98.2 F (36.8 C), temperature source Oral, resp. rate 18, height 5\' 6"  (1.676 m), weight 265 lb (120.203 kg). Physical Examination:  General appearance - alert, well appearing, and in no distress  Heart - normal rate and regular rhythm  Abdomen - soft, nontender, nondistended  Fundal Height: size equals dates  Cervical Exam: Not evaluated.  Extremities: extremities normal, atraumatic, no cyanosis or edema and Homans sign is negative, no sign of DVT with DTRs 2+ bilaterally  Membranes:ruptured  Fetal Monitoring: Baseline: 150 bpm, Variability: Moderate {> 6 bpm) and Accelerations: Reactive. Rare contractions Labs:  No results found for this or any previous visit (from the past 24 hour(s)).  Imaging Studies:  08/21/12 EFW 1940g/51%, AFI 11.29cm, cephalic  08/19/12 LIMITED OBSTETRIC ULTRASOUND  Clinical Data: Cervical insufficiency. Cerclage. Premature rupture of membranes. Assigned gestational age is 32 weeks 2 days. Number of Fetuses: 1  Heart Rate: 130 to bpm  Movement: Fetal movement is identified.  Presentation: Fetus is in cephalic presentation.  Placental Location: The placenta is posterior.  Previa: No previa.  Amniotic Fluid (Subjective): Normal.  Vertical pocket: 5.74cm AFI: 13.39 cm (5%ile 8.6 cm, 95%ile 24.2 cm)  BPD: 8.27cm, 32 weeks 2 days EDC: 10/05/2012  MATERNAL FINDINGS:  Cervix: Cervical length measures 3.18 cm but is not well visualized. Cervix appears closed.  Uterus/Adnexae: Limited  visualization. No discrete abnormality identified.  IMPRESSION:  Single intrauterine pregnancy. No specific complication identified. Cervix appears closed and amniotic fluid volume appears normal.  Medications: Scheduled     . amoxicillin  500 mg Oral Q8H  . docusate sodium  100 mg Oral Daily  . erythromycin  250 mg Oral Q6H  . prenatal multivitamin  1 tablet Oral Daily  . sodium chloride  3 mL Intravenous Q12H   I have reviewed the patient's current medications.   ASSESSMENT:  Patient Active Problem List  Diagnosis  . Cervical incompetence affecting management of pregnancy, antepartum  . Preterm premature rupture of membranes in third trimester   PLAN: Received betamethasone x 2 doses, magnesium sulfate initially Continue latency antibiotics Delivery at 34 weeks or for s/sx of chorioamnionitis CBC and T&S ordered for today; will repeat in 3 days Routine antenatal care

## 2012-08-23 ENCOUNTER — Other Ambulatory Visit (HOSPITAL_COMMUNITY): Payer: Medicaid Other

## 2012-08-23 LAB — TYPE AND SCREEN
ABO/RH(D): A POS
Antibody Screen: NEGATIVE
Unit division: 0

## 2012-08-23 MED ORDER — FAMOTIDINE 20 MG PO TABS
20.0000 mg | ORAL_TABLET | Freq: Two times a day (BID) | ORAL | Status: DC
  Administered 2012-08-23 – 2012-08-29 (×14): 20 mg via ORAL
  Filled 2012-08-23 (×14): qty 1

## 2012-08-23 NOTE — Progress Notes (Signed)
Patient ID: Briana Nichols, female   DOB: [birth year], 26 y.o.   MRN: [id number] FACULTY PRACTICE ANTEPARTUM(COMPREHENSIVE) NOTE  Briana Nichols is a 26 y.o. G3P1011 at [redacted]w[redacted]d by best clinical estimate who is admitted for PROM.   Fetal presentation is cephalic. Length of Stay:  5  Days  Subjective: No contractions. Heartburn  Patient reports the fetal movement as active. Patient reports uterine contraction  activity as none. Patient reports  vaginal bleeding as none. Patient describes fluid per vagina as Clear.  Vitals:  Blood pressure 129/60, pulse 75, temperature 98.4 F (36.9 C), temperature source Oral, resp. rate 20, height 5\' 6"  (1.676 m), weight 265 lb (120.203 kg). Physical Examination:  General appearance - alert, well appearing, and in no distress Heart - normal rate and regular rhythm Abdomen - soft, nontender, nondistended Fundal Height:  size equals dates, not tender Cervical Exam: Not evaluated. and found to be not evaluated/ Extremities: extremities normal, atraumatic, no cyanosis or edema and Homans sign is negative, no sign of DVT with DTRs 2+ bilaterally Membranes:ruptured, clear fluid  Fetal Monitoring:  Baseline: 150 bpm, reactive no decelerations  Labs:  Recent Results (from the past 24 hour(s))  CBC   Collection Time   08/22/12  8:10 AM      Component Value Range   WBC 9.6  4.0 - 10.5 K/uL   RBC 3.73 (*) 3.87 - 5.11 MIL/uL   Hemoglobin 10.8 (*) 12.0 - 15.0 g/dL   HCT 40.9 (*) 81.1 - 91.4 %   MCV 86.6  78.0 - 100.0 fL   MCH 29.0  26.0 - 34.0 pg   MCHC 33.4  30.0 - 36.0 g/dL   RDW 78.2  95.6 - 21.3 %   Platelets 142 (*) 150 - 400 K/uL  TYPE AND SCREEN   Collection Time   08/22/12  8:10 AM      Component Value Range   ABO/RH(D) A POS     Antibody Screen NEG     Sample Expiration 08/25/2012      Imaging Studies:     Currently EPIC will not allow sonographic studies to automatically populate into notes.  In the meantime, copy and paste results into  note or free text.  Medications:  Scheduled    . amoxicillin  500 mg Oral Q8H  . docusate sodium  100 mg Oral Daily  . erythromycin  250 mg Oral Q6H  . famotidine  20 mg Oral BID  . prenatal multivitamin  1 tablet Oral Daily  . sodium chloride  3 mL Intravenous Q12H   I have reviewed the patient's current medications.  ASSESSMENT: Patient Active Problem List  Diagnosis  . Cervical incompetence affecting management of pregnancy, antepartum  . Preterm premature rupture of membranes in third trimester    PLAN: Continue current management in hospital  Briana Nichols 08/23/2012,7:32 AM

## 2012-08-23 NOTE — Progress Notes (Signed)
08/23/12 1300  Clinical Encounter Type  Visited With Patient  Visit Type Initial;Spiritual support;Social support  Recommendations Spiritual Care will follow for suppport.  Spiritual Encounters  Spiritual Needs Emotional    Made initial visit to introduce chaplain services and availability.  Ms Hallett was appreciative of opportunity to share and process her story, including themes of single motherhood and complex social situation.  She reports great gratitude for and  support from family, friends, and neighbors.  Provided pastoral presence, listening, reflection, encouragement.  Will continue to follow for support.  Pt aware of ongoing chaplain availability.  [address] [place], [place] [phone]

## 2012-08-24 NOTE — Progress Notes (Signed)
FACULTY PRACTICE ANTEPARTUM(COMPREHENSIVE) NOTE  Briana Nichols is a 26 y.o. G3P1011 at [redacted]w[redacted]d who is admitted for PROM, with a Prolene cerclage still in place, stitch knot at 12 oclock on cervix.   Fetal presentation is cephalic. Length of Stay:  6  Days  Subjective: No complaints no contractions Patient reports the fetal movement as active. Patient reports uterine contraction  activity as none. Patient reports  vaginal bleeding as none. Patient describes fluid per vagina as Clear.  Vitals:  Blood pressure 107/58, pulse 100, temperature 98 F (36.7 C), temperature source Oral, resp. rate 18, height 5\' 6"  (1.676 m), weight 265 lb (120.203 kg). Physical Examination:  General appearance - alert, well appearing, and in no distress Heart - normal rate and regular rhythm Abdomen - soft, nontender, nondistended Fundal Height:  size equals dates Cervical Exam: Not evaluated. CERCLAGE STILL  IN PLACE BY HISTORY Extremities: extremities normal, atraumatic, no cyanosis or edema and Homans sign is negative, no sign of DVT with DTRs 2+ bilaterally Membranes:intact, ruptured  Fetal Monitoring:  Baseline: 145 bpm  Labs:  No results found for this or any previous visit (from the past 24 hour(s)).  Imaging Studies:    Marland Kitchen  Medications:  Scheduled    . amoxicillin  500 mg Oral Q8H  . docusate sodium  100 mg Oral Daily  . erythromycin  250 mg Oral Q6H  . famotidine  20 mg Oral BID  . prenatal multivitamin  1 tablet Oral Daily  . [DISCONTINUED] sodium chloride  3 mL Intravenous Q12H   I have reviewed the patient's current medications.  ASSESSMENT: Patient Active Problem List  Diagnosis  . Cervical incompetence affecting management of pregnancy, antepartum  . Preterm premature rupture of membranes in third trimester    PLAN: dELIVER IN one week, or earlier prn evidence of chorioamnionitis  Hellickson,Nayelly Laughman V 08/24/2012,7:59 AM

## 2012-08-25 LAB — CBC
HCT: 33.8 % — ABNORMAL LOW (ref 36.0–46.0)
MCHC: 33.7 g/dL (ref 30.0–36.0)
RDW: 13.5 % (ref 11.5–15.5)

## 2012-08-25 MED ORDER — NYSTATIN 100000 UNIT/GM EX CREA
TOPICAL_CREAM | Freq: Two times a day (BID) | CUTANEOUS | Status: DC
  Administered 2012-08-25 – 2012-08-27 (×6): via TOPICAL
  Filled 2012-08-25: qty 15

## 2012-08-25 NOTE — Progress Notes (Signed)
Patient ID: Briana Nichols, female   DOB: [birth year], 26 y.o.   MRN: [id number] FACULTY PRACTICE ANTEPARTUM(COMPREHENSIVE) NOTE  Clinton Wahlberg is a 26 y.o. G3P1011 at [redacted]w[redacted]d who is admitted for PROM.   Fetal presentation is cephalic. Length of Stay:  7  Days  Subjective: No complaints no contractions Patient reports the fetal movement as active. Patient reports uterine contraction  activity as none. Patient reports  vaginal bleeding as none. Patient describes fluid per vagina as Clear.  Vitals:  Blood pressure 151/69, pulse 92, temperature 97.9 F (36.6 C), temperature source Oral, resp. rate 18, height 5\' 6"  (1.676 m), weight 120.203 kg (265 lb). Physical Examination:  General appearance - alert, well appearing, and in no distress Heart - normal rate and regular rhythm Abdomen - soft, nontender, nondistended Fundal Height:  size equals dates Cervical Exam: Not evaluated. CERCLAGE STILL  IN PLACE BY HISTORY Extremities: extremities normal, atraumatic, no cyanosis or edema and Homans sign is negative, no sign of DVT with DTRs 2+ bilaterally Membranes:intact, ruptured  Fetal Monitoring:  Baseline: 145 bpm, moderate variability, positive accels, no decels Toco: no contractions  Labs:  No results found for this or any previous visit (from the past 24 hour(s)).  Imaging Studies:    Marland Kitchen  Medications:  Scheduled    . amoxicillin  500 mg Oral Q8H  . docusate sodium  100 mg Oral Daily  . erythromycin  250 mg Oral Q6H  . famotidine  20 mg Oral BID  . prenatal multivitamin  1 tablet Oral Daily  . [DISCONTINUED] sodium chloride  3 mL Intravenous Q12H   I have reviewed the patient's current medications.  ASSESSMENT: Patient Active Problem List  Diagnosis  . Cervical incompetence affecting management of pregnancy, antepartum  . Preterm premature rupture of membranes in third trimester    PLAN: Continue current antepartum care Continue monitoring for si/sx of chorio Delivery  plan at 34 weeks or sooner if chorio  Bartlett Enke 08/25/2012,6:59 AM

## 2012-08-26 NOTE — Progress Notes (Signed)
Patient ID: Briana Nichols, female   DOB: 09-10-1986, 26 y.o.   MRN: [id number] FACULTY PRACTICE ANTEPARTUM(COMPREHENSIVE) NOTE  Briana Nichols is a 26 y.o. G3P1011 at [redacted]w[redacted]d by best clinical estimate who is admitted for PROM.   Fetal presentation is cephalic. Length of Stay:  8  Days  Subjective: Still leaking a lot of fluid, has some vaginal discomfort. Patient reports the fetal movement as active. Patient reports uterine contraction  activity as none. Patient reports  vaginal bleeding as none. Patient describes fluid per vagina as Clear.  Vitals:  Blood pressure 101/55, pulse 97, temperature 97.8 F (36.6 C), temperature source Oral, resp. rate 20, height 5\' 6"  (1.676 m), weight 265 lb (120.203 kg). Physical Examination:  General appearance - alert, well appearing, and in no distress Abdomen - soft, nontender, nondistended, no masses or organomegaly Fundal Height:  size equals dates Extremities: extremities normal, atraumatic, no cyanosis or edema  Membranes:intact  Fetal Monitoring:  Baseline: 150 bpm, Variability: Good {> 6 bpm), Accelerations: Reactive and Decelerations: Absent  Labs:  No results found for this or any previous visit (from the past 24 hour(s)).   Medications:  Scheduled    . [COMPLETED] amoxicillin  500 mg Oral Q8H  . docusate sodium  100 mg Oral Daily  . [COMPLETED] erythromycin  250 mg Oral Q6H  . famotidine  20 mg Oral BID  . nystatin cream   Topical BID  . prenatal multivitamin  1 tablet Oral Daily   I have reviewed the patient's current medications.  ASSESSMENT: Patient Active Problem List  Diagnosis  . Cervical incompetence affecting management of pregnancy, antepartum  . Preterm premature rupture of membranes in third trimester    PLAN: Continue plan of care--Need for cerclage removal prior to labor discussed with the pt.  Delivery with s/sx's of chorio or at 34 wks.  Briana Nichols S 08/26/2012,7:14 AM

## 2012-08-27 NOTE — Progress Notes (Signed)
Patient ID: Briana Nichols, female   DOB: 1986/04/19, 26 y.o.   MRN: [id number] Patient ID: Briana Nichols, female   DOB: 1986-07-18, 26 y.o.   MRN: [id number] FACULTY PRACTICE ANTEPARTUM(COMPREHENSIVE) NOTE  Briana Nichols is a 26 y.o. G3P1011 at [redacted]w[redacted]d by best clinical estimate who is admitted for PPROM.   Fetal presentation is cephalic. Length of Stay:  9  Days  Subjective: Still leaking a lot of fluid, otherwise without any complaints. Patient reports the fetal movement as active. Patient reports uterine contraction  activity as none. Patient reports  vaginal bleeding as none. Patient describes fluid per vagina as Clear.  Vitals:  Blood pressure 112/60, pulse 105, temperature 98.6 F (37 C), temperature source Oral, resp. rate 20, height 5\' 6"  (1.676 m), weight 120.203 kg (265 lb). Physical Examination:  General appearance - alert, well appearing, and in no distress Abdomen - soft, gravid, nontender Fundal Height:  size equals dates Extremities: extremities normal, atraumatic, no cyanosis or edema  Membranes:intact  Fetal Monitoring:  Baseline: 150 bpm, Variability: Good {> 6 bpm), Accelerations: Reactive and Decelerations: Absent Toco: no contractions  Labs:  No results found for this or any previous visit (from the past 24 hour(s)).   Medications:  Scheduled    . docusate sodium  100 mg Oral Daily  . famotidine  20 mg Oral BID  . nystatin cream   Topical BID  . prenatal multivitamin  1 tablet Oral Daily   I have reviewed the patient's current medications.  ASSESSMENT: Patient Active Problem List  Diagnosis  . Cervical incompetence affecting management of pregnancy, antepartum  . Preterm premature rupture of membranes in third trimester    PLAN: 26 yo G3P1011 at [redacted]w[redacted]d with PPROM s/p bethamethasone and latency antibiotics with cervical cerclage in place - Continue current care - Plan for delivery at 34 weeks or sooner if si/sx of chorio - Cerclage removal prior  to induction of labor.  Mozes Sagar 08/27/2012,6:37 AM

## 2012-08-28 LAB — CBC
Hemoglobin: 11.4 g/dL — ABNORMAL LOW (ref 12.0–15.0)
MCH: 28.7 pg (ref 26.0–34.0)
MCHC: 33.3 g/dL (ref 30.0–36.0)

## 2012-08-28 LAB — TYPE AND SCREEN: Antibody Screen: NEGATIVE

## 2012-08-28 NOTE — Progress Notes (Signed)
33 4/[redacted] weeks gestation, with PROM, cerclage.  Height  66" Weight 265 Lbs pre-pregnancy weight 265 Lbs.Pre-pregnancy  BMI 42.9  IBW 130 Lbs  Total weight gain none. Weight gain goals 11-15 Lbs.   Estimated needs: 21-2300 kcal/day, 80-90 grams protein/day, 2.6 liters fluid/day antenatal regular diet tolerated well, appetite good. Decreased appetite PTA, but excellent appetite since admision Current diet prescription will provide for increased needs.Oriented to snack selections No abnormal nutrition related labs. Hx GDM with previous pregnancy, reports GTT wnl this pregnancy  Nutrition Dx: Increased nutrient needs r/t pregnancy and fetal growth requirements aeb [redacted] weeks gestation.  No educational needs assessed at this time.  Briana Nichols LDN Neonatal Nutrition Support Specialist Pager [phone]

## 2012-08-28 NOTE — Progress Notes (Signed)
Patient ID: Briana Nichols, female   DOB: [birth year], 26 y.o.   MRN: [id number]  FACULTY PRACTICE ANTEPARTUM(COMPREHENSIVE) NOTE  Briana Nichols is a 25 y.o. G3P1011 at [redacted]w[redacted]d  who is admitted for PPROM.   Length of Stay:  10  Days  Subjective: Pt would like to go on a wheelchair ride Patient reports the fetal movement as active. Patient reports uterine contraction  activity as none. Patient reports  vaginal bleeding as none. Patient describes fluid per vagina as Clear.  Vitals:  Blood pressure 124/69, pulse 107, temperature 98.4 F (36.9 C), temperature source Oral, resp. rate 20, height 5\' 6"  (1.676 m), weight 120.203 kg (265 lb). Physical Examination:  General appearance - alert, well appearing, and in no distress Mental status - normal mood, behavior, speech, dress, motor activity, and thought processes Abdomen - soft, no fundal tenderness Extremities - no edema, redness or tenderness in the calves or thighs, Homan's sign negative bilaterally  Fetal Monitoring:  Baseline: 150 bpm, Variability: Good {> 6 bpm), Accelerations: Reactive and Decelerations: Absent  Labs:  No results found for this or any previous visit (from the past 24 hour(s)).   Medications:  Scheduled    . docusate sodium  100 mg Oral Daily  . famotidine  20 mg Oral BID  . nystatin cream   Topical BID  . prenatal multivitamin  1 tablet Oral Daily   I have reviewed the patient's current medications.  ASSESSMENT: Patient Active Problem List  Diagnosis  . Cervical incompetence affecting management of pregnancy, antepartum  . Preterm premature rupture of membranes in third trimester    PLAN: Briana Nichols is a 26 y.o. G3P1011 at [redacted]w[redacted]d  who is admitted for PPROM.    No signs of chorioamnionitis Wheelchair ride Continue current care  Briana Reeb H. 08/28/2012,7:24 AM

## 2012-08-29 LAB — TYPE AND SCREEN
ABO/RH(D): A POS
Antibody Screen: NEGATIVE
Unit division: 0

## 2012-08-29 NOTE — Progress Notes (Signed)
Patient ID: Briana Nichols, female   DOB: 09-02-1986, 26 y.o.   MRN: [id number] FACULTY PRACTICE ANTEPARTUM(COMPREHENSIVE) NOTE  Briana Nichols is a 27 y.o. G3P1011 at [redacted]w[redacted]d by early ultrasound who is admitted for PROM.   Fetal presentation is cephalic. Length of Stay:  11  Days  Subjective: Had some pain with is spacing out now Patient reports the fetal movement as active. Patient reports uterine contraction  activity as irregular, spacing out. Patient reports  vaginal bleeding as none. Patient describes fluid per vagina as Clear.  Vitals:  Blood pressure 122/64, pulse 98, temperature 97.9 F (36.6 C), temperature source Oral, resp. rate 18, height 5\' 6"  (1.676 m), weight 265 lb (120.203 kg). Physical Examination:  General appearance - alert, well appearing, and in no distress Heart - normal rate and regular rhythm Abdomen - soft, nontender, nondistended Fundal Height:  size equals dates, not tender Cervical Exam: Not evaluated.  Extremities: extremities normal, atraumatic, no cyanosis or edema and Homans sign is negative, no sign of DVT with DTRs 2+ bilaterally Membranes:ruptured, clear fluid  Fetal Monitoring:  Baseline: 155 bpm, Variability: Fair (1-6 bpm) and Accelerations: none seen for 15 min  Labs:  No results found for this or any previous visit (from the past 24 hour(s)).  Imaging Studies:     cephalic by [place]  Medications:  Scheduled    . docusate sodium  100 mg Oral Daily  . famotidine  20 mg Oral BID  . nystatin cream   Topical BID  . prenatal multivitamin  1 tablet Oral Daily   I have reviewed the patient's current medications.  ASSESSMENT: Patient Active Problem List  Diagnosis  . Cervical incompetence affecting management of pregnancy, antepartum  . Preterm premature rupture of membranes in third trimester    PLAN: Observe for signs of active labor, would need cerclage removal  ARNOLD,JAMES 08/29/2012,7:11 AM

## 2012-08-30 ENCOUNTER — Encounter (HOSPITAL_COMMUNITY): Payer: Self-pay | Admitting: Anesthesiology

## 2012-08-30 ENCOUNTER — Encounter (HOSPITAL_COMMUNITY): Payer: Self-pay | Admitting: *Deleted

## 2012-08-30 ENCOUNTER — Inpatient Hospital Stay (HOSPITAL_COMMUNITY): Payer: Medicaid Other | Admitting: Anesthesiology

## 2012-08-30 DIAGNOSIS — O4290 Premature rupture of membranes, unspecified as to length of time between rupture and onset of labor, unspecified weeks of gestation: Secondary | ICD-10-CM

## 2012-08-30 DIAGNOSIS — O3430 Maternal care for cervical incompetence, unspecified trimester: Secondary | ICD-10-CM

## 2012-08-30 LAB — RPR: RPR Ser Ql: NONREACTIVE

## 2012-08-30 LAB — CBC
MCH: 29.6 pg (ref 26.0–34.0)
MCHC: 34.6 g/dL (ref 30.0–36.0)
MCV: 85.6 fL (ref 78.0–100.0)
Platelets: 184 10*3/uL (ref 150–400)
RDW: 13.5 % (ref 11.5–15.5)
WBC: 9 10*3/uL (ref 4.0–10.5)

## 2012-08-30 MED ORDER — BENZOCAINE-MENTHOL 20-0.5 % EX AERO: 1.0000 "application " | INHALATION_SPRAY | CUTANEOUS | Status: DC | PRN

## 2012-08-30 MED ORDER — BUTORPHANOL TARTRATE 1 MG/ML IJ SOLN
2.0000 mg | Freq: Once | INTRAMUSCULAR | Status: AC
  Administered 2012-08-30: 2 mg via INTRAVENOUS

## 2012-08-30 MED ORDER — PHENYLEPHRINE 40 MCG/ML (10ML) SYRINGE FOR IV PUSH (FOR BLOOD PRESSURE SUPPORT)
80.0000 ug | PREFILLED_SYRINGE | INTRAVENOUS | Status: DC | PRN
  Filled 2012-08-30: qty 5

## 2012-08-30 MED ORDER — ONDANSETRON HCL 4 MG/2ML IJ SOLN: 4.0000 mg | Freq: Four times a day (QID) | INTRAMUSCULAR | Status: DC | PRN

## 2012-08-30 MED ORDER — ONDANSETRON HCL 4 MG/2ML IJ SOLN: 4.0000 mg | INTRAMUSCULAR | Status: DC | PRN

## 2012-08-30 MED ORDER — ONDANSETRON HCL 4 MG PO TABS: 4.0000 mg | ORAL_TABLET | ORAL | Status: DC | PRN

## 2012-08-30 MED ORDER — ZOLPIDEM TARTRATE 5 MG PO TABS
5.0000 mg | ORAL_TABLET | Freq: Every evening | ORAL | Status: DC | PRN
  Administered 2012-08-31: 5 mg via ORAL
  Filled 2012-08-30: qty 1

## 2012-08-30 MED ORDER — OXYCODONE-ACETAMINOPHEN 5-325 MG PO TABS: 1.0000 | ORAL_TABLET | ORAL | Status: DC | PRN

## 2012-08-30 MED ORDER — LACTATED RINGERS IV SOLN: INTRAVENOUS | Status: DC

## 2012-08-30 MED ORDER — DIPHENHYDRAMINE HCL 25 MG PO CAPS: 25.0000 mg | ORAL_CAPSULE | Freq: Four times a day (QID) | ORAL | Status: DC | PRN

## 2012-08-30 MED ORDER — IBUPROFEN 600 MG PO TABS: 600.0000 mg | ORAL_TABLET | Freq: Four times a day (QID) | ORAL | Status: DC | PRN

## 2012-08-30 MED ORDER — EPHEDRINE 5 MG/ML INJ
10.0000 mg | INTRAVENOUS | Status: DC | PRN
  Filled 2012-08-30: qty 4

## 2012-08-30 MED ORDER — BUTORPHANOL TARTRATE 1 MG/ML IJ SOLN
INTRAMUSCULAR | Status: AC
  Filled 2012-08-30: qty 2

## 2012-08-30 MED ORDER — LIDOCAINE HCL (PF) 1 % IJ SOLN
30.0000 mL | INTRAMUSCULAR | Status: DC | PRN
  Filled 2012-08-30: qty 30

## 2012-08-30 MED ORDER — LIDOCAINE HCL (PF) 1 % IJ SOLN
INTRAMUSCULAR | Status: DC | PRN
  Administered 2012-08-30 (×2): 9 mL

## 2012-08-30 MED ORDER — LACTATED RINGERS IV SOLN: 500.0000 mL | INTRAVENOUS | Status: DC | PRN

## 2012-08-30 MED ORDER — DIPHENHYDRAMINE HCL 50 MG/ML IJ SOLN: 12.5000 mg | INTRAMUSCULAR | Status: DC | PRN

## 2012-08-30 MED ORDER — OXYTOCIN BOLUS FROM INFUSION: 500.0000 mL | INTRAVENOUS | Status: DC

## 2012-08-30 MED ORDER — LACTATED RINGERS IV SOLN
500.0000 mL | Freq: Once | INTRAVENOUS | Status: AC
  Administered 2012-08-30: 500 mL via INTRAVENOUS

## 2012-08-30 MED ORDER — DIBUCAINE 1 % RE OINT: 1.0000 "application " | TOPICAL_OINTMENT | RECTAL | Status: DC | PRN

## 2012-08-30 MED ORDER — PHENYLEPHRINE 40 MCG/ML (10ML) SYRINGE FOR IV PUSH (FOR BLOOD PRESSURE SUPPORT): 80.0000 ug | PREFILLED_SYRINGE | INTRAVENOUS | Status: DC | PRN

## 2012-08-30 MED ORDER — LANOLIN HYDROUS EX OINT: TOPICAL_OINTMENT | CUTANEOUS | Status: DC | PRN

## 2012-08-30 MED ORDER — SIMETHICONE 80 MG PO CHEW: 80.0000 mg | CHEWABLE_TABLET | ORAL | Status: DC | PRN

## 2012-08-30 MED ORDER — IBUPROFEN 600 MG PO TABS
600.0000 mg | ORAL_TABLET | Freq: Four times a day (QID) | ORAL | Status: DC
  Administered 2012-08-30 – 2012-09-01 (×8): 600 mg via ORAL
  Filled 2012-08-30 (×8): qty 1

## 2012-08-30 MED ORDER — LACTATED RINGERS IV SOLN
INTRAVENOUS | Status: DC
  Administered 2012-08-30: 03:00:00 via INTRAVENOUS

## 2012-08-30 MED ORDER — ACETAMINOPHEN 325 MG PO TABS: 650.0000 mg | ORAL_TABLET | ORAL | Status: DC | PRN

## 2012-08-30 MED ORDER — FENTANYL 2.5 MCG/ML BUPIVACAINE 1/10 % EPIDURAL INFUSION (WH - ANES)
14.0000 mL/h | INTRAMUSCULAR | Status: DC
  Filled 2012-08-30: qty 125

## 2012-08-30 MED ORDER — FENTANYL CITRATE 0.05 MG/ML IJ SOLN: 100.0000 ug | INTRAMUSCULAR | Status: DC | PRN

## 2012-08-30 MED ORDER — WITCH HAZEL-GLYCERIN EX PADS: 1.0000 "application " | MEDICATED_PAD | CUTANEOUS | Status: DC | PRN

## 2012-08-30 MED ORDER — SENNOSIDES-DOCUSATE SODIUM 8.6-50 MG PO TABS
2.0000 | ORAL_TABLET | Freq: Every day | ORAL | Status: DC
  Administered 2012-08-30 – 2012-08-31 (×2): 2 via ORAL

## 2012-08-30 MED ORDER — OXYTOCIN 40 UNITS IN LACTATED RINGERS INFUSION - SIMPLE MED
62.5000 mL/h | INTRAVENOUS | Status: DC
  Administered 2012-08-30: 62.5 mL/h via INTRAVENOUS
  Filled 2012-08-30: qty 1000

## 2012-08-30 MED ORDER — EPHEDRINE 5 MG/ML INJ: 10.0000 mg | INTRAVENOUS | Status: DC | PRN

## 2012-08-30 MED ORDER — FENTANYL 2.5 MCG/ML BUPIVACAINE 1/10 % EPIDURAL INFUSION (WH - ANES)
INTRAMUSCULAR | Status: DC | PRN
  Administered 2012-08-30: 14 mL/h via EPIDURAL

## 2012-08-30 MED ORDER — PENICILLIN G POTASSIUM 5000000 UNITS IJ SOLR
2.5000 10*6.[IU] | INTRAVENOUS | Status: DC
  Filled 2012-08-30 (×3): qty 2.5

## 2012-08-30 MED ORDER — PRENATAL MULTIVITAMIN CH
1.0000 | ORAL_TABLET | Freq: Every day | ORAL | Status: DC
  Administered 2012-08-30 – 2012-09-01 (×3): 1 via ORAL
  Filled 2012-08-30 (×3): qty 1

## 2012-08-30 MED ORDER — TETANUS-DIPHTH-ACELL PERTUSSIS 5-2.5-18.5 LF-MCG/0.5 IM SUSP
0.5000 mL | Freq: Once | INTRAMUSCULAR | Status: AC
  Administered 2012-08-31: 0.5 mL via INTRAMUSCULAR
  Filled 2012-08-30: qty 0.5

## 2012-08-30 MED ORDER — CITRIC ACID-SODIUM CITRATE 334-500 MG/5ML PO SOLN: 30.0000 mL | ORAL | Status: DC | PRN

## 2012-08-30 MED ORDER — PENICILLIN G POTASSIUM 5000000 UNITS IJ SOLR
5.0000 10*6.[IU] | Freq: Once | INTRAVENOUS | Status: AC
  Administered 2012-08-30: 5 10*6.[IU] via INTRAVENOUS
  Filled 2012-08-30: qty 5

## 2012-08-30 NOTE — Anesthesia Procedure Notes (Signed)
Epidural Patient location during procedure: OB Start time: 08/30/2012 3:58 AM End time: 08/30/2012 4:04 AM  Staffing Anesthesiologist: Sandrea Hughs Performed by: anesthesiologist   Preanesthetic Checklist Completed: patient identified, site marked, surgical consent, pre-op evaluation, timeout performed, IV checked, risks and benefits discussed and monitors and equipment checked  Epidural Patient position: sitting Prep: site prepped and draped and DuraPrep Patient monitoring: continuous pulse ox and blood pressure Approach: midline Injection technique: LOR air  Needle:  Needle type: Tuohy  Needle gauge: 17 G Needle length: 9 cm and 9 Needle insertion depth: 9 cm Catheter type: closed end flexible Catheter size: 19 Gauge Catheter at skin depth: 14 cm Test dose: negative and Other  Assessment Sensory level: T8 Events: blood not aspirated, injection not painful, no injection resistance, negative IV test and no paresthesia  Additional Notes Reason for block:procedure for pain

## 2012-08-30 NOTE — Anesthesia Preprocedure Evaluation (Signed)
Anesthesia Evaluation  Patient identified by MRN, date of birth, ID band Patient awake    Reviewed: Allergy & Precautions, H&P , NPO status , Patient's Chart, lab work & pertinent test results  Airway Mallampati: III TM Distance: >3 FB Neck ROM: full    Dental No notable dental hx.    Pulmonary neg pulmonary ROS,    Pulmonary exam normal       Cardiovascular negative cardio ROS      Neuro/Psych negative neurological ROS  negative psych ROS   GI/Hepatic negative GI ROS, Neg liver ROS,   Endo/Other  Morbid obesity  Renal/GU negative Renal ROS  negative genitourinary   Musculoskeletal negative musculoskeletal ROS (+)   Abdominal (+) + obese,   Peds negative pediatric ROS (+)  Hematology negative hematology ROS (+)   Anesthesia Other Findings   Reproductive/Obstetrics (+) Pregnancy                           Anesthesia Physical Anesthesia Plan  ASA: III  Anesthesia Plan: Epidural   Post-op Pain Management:    Induction:   Airway Management Planned:   Additional Equipment:   Intra-op Plan:   Post-operative Plan:   Informed Consent: I have reviewed the patients History and Physical, chart, labs and discussed the procedure including the risks, benefits and alternatives for the proposed anesthesia with the patient or authorized representative who has indicated his/her understanding and acceptance.     Plan Discussed with:   Anesthesia Plan Comments:         Anesthesia Quick Evaluation

## 2012-08-30 NOTE — Progress Notes (Signed)
Patient ID: Briana Nichols, female   DOB: 12-14-1985, 26 y.o.   MRN: [id number] Called to see pt for eval of reg ctx.  SSE performed: able to visualize stitch but unable to clearly see cx due to copious amounts of amniotic fluid. Dr Erin Fulling requested to come and eval for removal of cerclage. This was effectively done with cx after cerclage removal 3/70/vtx by bedside U/S. Will transfer to L&D and manage expectantly with the possible need for Pitocin aug if active labor not achieved. PCN G for unknown GBS.  Cam Hai 08/30/2012 2:39 AM

## 2012-08-30 NOTE — Anesthesia Postprocedure Evaluation (Signed)
  Anesthesia Post-op Note  Patient: Briana Nichols  Procedure(s) Performed: * No procedures listed *  Patient Location: [facility] Unit  Anesthesia Type:Epidural  Level of Consciousness: awake, alert  and oriented  Airway and Oxygen Therapy: Patient Spontanous Breathing  Post-op Pain: mild  Post-op Assessment: Patient's Cardiovascular Status Stable, Respiratory Function Stable, No headache, No backache, No residual numbness and No residual motor weakness  Post-op Vital Signs: stable  Complications: No apparent anesthesia complications

## 2012-08-31 ENCOUNTER — Inpatient Hospital Stay (HOSPITAL_COMMUNITY): Admit: 2012-08-31 | Payer: Medicaid Other

## 2012-08-31 LAB — CBC
HCT: 35.4 % — ABNORMAL LOW (ref 36.0–46.0)
Hemoglobin: 11.9 g/dL — ABNORMAL LOW (ref 12.0–15.0)
MCHC: 33.6 g/dL (ref 30.0–36.0)
RBC: 4.08 MIL/uL (ref 3.87–5.11)

## 2012-08-31 NOTE — Progress Notes (Signed)
UR completed 

## 2012-08-31 NOTE — Progress Notes (Signed)
Post Partum Day 1 Subjective: up ad lib, voiding and tolerating PO; breast; depo possibly as family planning.    Objective: Blood pressure 130/63, pulse 105, temperature 98.1 F (36.7 C), temperature source Oral, resp. rate 18, height 5\' 6"  (1.676 m), weight 120.203 kg (265 lb), SpO2 98.00%, unknown if currently breastfeeding.  Physical Exam:  General: alert, cooperative and appears stated age Lochia: appropriate Uterine Fundus: firm Incision: n/a DVT Evaluation: No evidence of DVT seen on physical exam. Negative Homan's sign.   Basename 08/31/12 0550 08/30/12 0305  HGB 11.9* 12.3  HCT 35.4* 35.6*    Assessment/Plan: Plan for discharge tomorrow and Breastfeeding   LOS: 13 days   [facility] 08/31/2012, 9:34 AM

## 2012-09-01 MED ORDER — SENNOSIDES-DOCUSATE SODIUM 8.6-50 MG PO TABS: 2.0000 | ORAL_TABLET | Freq: Every day | ORAL | Status: DC

## 2012-09-01 MED ORDER — IBUPROFEN 600 MG PO TABS: 600.0000 mg | ORAL_TABLET | Freq: Four times a day (QID) | ORAL | Status: DC

## 2012-09-01 NOTE — Discharge Summary (Signed)
Obstetric Discharge Summary Reason for Admission: rupture of membranes and History of cervical incompetency, with McDonald cerclage in place, 33 weeks 2 days Briana Nichols is a 26 y.o. female presenting for rupture of membranes. Pt was taken via EMS to a hospital in [place], [place]. She had a +amnisure. Ultrasound there showed the baby to be vertex. No other ultrasound evaluation done. Pt was started on magnesium and give 4 gram load and 2 grams/hour. She was given 1 dose of BMZ, and Ampicillin and Zithromax. ER doctor consulted with Dr. Macon Large, and patient was brought here via EMS. On arrival she is complaining of intermittent LLQ pain.   Prenatal course followed at [facility] with cervical incompetency and diagnosed based on cervical shortening noted on her 18 week screening exam, placed on Prometrium, with progression of cervical shortening and cerclage placed at 21 weeks   Prenatal Procedures: cerclage and ultrasound Intrapartum Procedures: spontaneous vaginal delivery and After removal of cerclageDelivery Note  At 5:49 AM a viable female was delivered via Vaginal, Spontaneous Delivery (Presentation: ; Occiput Anterior). APGAR: , 9; weight .  Placenta status: Intact, Spontaneous. Cord: 3 vessels with the following complications:None  Anesthesia: Epidural  Episiotomy: None  Est. Blood Loss (mL): 350  Mom to postpartum. Baby to NICU.  Postpartum Procedures: none Complications-Operative and Postpartum: none Hemoglobin  Date Value Range Status  08/31/2012 11.9* 12.0 - 15.0 g/dL Final     HCT  Date Value Range Status  08/31/2012 35.4* 36.0 - 46.0 % Final    Physical Exam:  General: alert, cooperative and no distress Lochia: appropriate Uterine Fundus: firm Incision: None DVT Evaluation: No evidence of DVT seen on physical exam.  Discharge Diagnoses: Premature labor, PROM x2 weeks hours and Preterm labor at 33 weeks 6 days  Discharge Information: Date: 09/01/2012 Activity:  pelvic rest Diet: routine Medications: PNV, Ibuprofen and Contraception Depo-Provera at 4 weeks Condition: stable Instructions: refer to practice specific booklet Discharge to: home   Newborn Data: Live born female  Birth Weight:  APGAR: 9, 9  Home with Still in neonatal ICU but doing well.  Nichols,Briana Karger V 09/01/2012, 8:08 AM

## 2012-09-01 NOTE — Progress Notes (Signed)
Pt ambulated out  Teaching complete  pts  Mom with pt

## 2012-09-01 NOTE — Clinical Social Work Psychosocial (Signed)
Clinical Social Work Department PSYCHOSOCIAL ASSESSMENT - MATERNAL/CHILD 09/01/2012  Patient:  Briana Nichols, Briana Nichols  Account Number:  [id number]  Admit Date:  08/30/2012  Marjo Bicker Name:   Cathlean Sauer    Clinical Social Worker:  Vennie Homans, [place]   Date/Time:  09/01/2012 11:56 AM  Date Referred:  08/31/2012   Referral source  CSW     Referred reason  NICU   Other referral source:    I:  FAMILY / HOME ENVIRONMENT Child's legal guardian:  PARENT  Guardian - Name Guardian - Age Guardian - Address  Briana Nichols 26 [address] [place], [place]  Briana Nichols  lives with his mother   Other household support members/support persons Name Relationship DOB  Briana Nichols BROTHER 2yo   Other support:   Maternal grandmother and other extended family.  Parents are not together, but MOB thinks fOB will help some.    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 She reports due to her PT employment she does not receive benefits and does get some public assistance.   Financial resources:  Medicaid If Medicaid - County:    School / Grade:   Maternity Care Coordinator / Child Services Coordination / Early Interventions:  Cultural issues impacting care:   none    III  STRENGTHS Strengths  Home prepared for Child (including basic supplies)  Compliance with medical plan  Supportive family/friends   Strength comment:    IV  RISK FACTORS AND CURRENT PROBLEMS Current Problem:  None   Risk Factor & Current Problem Patient Issue Family Issue Risk Factor / Current Problem Comment   N N     V  SOCIAL WORK ASSESSMENT No risk factors noted. MOB shared FOB is some help, but this is his first baby and has some "learning to do". MOB shared she has support from her mother, but that she "likes to tell me what to do".  MOB lives in  her own apt with her 2yo son, Briana Nichols. She seems to have a good handle on baby care and how busy she will be with two.  Transportation will be of a concern as MOB lives an hour away. She has her own car, but gas may be an issue.      VI SOCIAL WORK PLAN Social Work Plan  Psychosocial Support/Ongoing Assessment of Needs  Information/Referral to [employer]   Type of pt/family education:   If child protective services report - county:   If child protective services report - date:   Information/referral to community resources comment:   CSW will check on gas card   Other social work plan:   CSW will follow for support and help with resources as appropriate.     Doreen Salvage, LCSW  Coverning NICU for [facility] M-F 8am-12pm

## 2012-11-27 ENCOUNTER — Encounter (HOSPITAL_COMMUNITY): Payer: Self-pay | Admitting: Pharmacy Technician

## 2012-12-06 ENCOUNTER — Encounter (HOSPITAL_COMMUNITY): Admission: RE | Admit: 2012-12-06 | Payer: Medicaid Other | Source: Ambulatory Visit

## 2012-12-12 ENCOUNTER — Ambulatory Visit (HOSPITAL_COMMUNITY)
Admission: RE | Admit: 2012-12-12 | Payer: Medicaid Other | Source: Ambulatory Visit | Admitting: Obstetrics and Gynecology

## 2012-12-12 ENCOUNTER — Encounter (HOSPITAL_COMMUNITY): Admission: RE | Payer: Self-pay | Source: Ambulatory Visit

## 2012-12-12 SURGERY — SALPINGECTOMY, BILATERAL, LAPAROSCOPIC
Anesthesia: General

## 2012-12-22 ENCOUNTER — Encounter: Payer: Self-pay | Admitting: Obstetrics and Gynecology

## 2012-12-26 ENCOUNTER — Ambulatory Visit (INDEPENDENT_AMBULATORY_CARE_PROVIDER_SITE_OTHER): Payer: Medicaid Other | Admitting: Obstetrics & Gynecology

## 2012-12-26 VITALS — BP 122/74 | Ht 66.0 in | Wt 266.0 lb

## 2012-12-26 DIAGNOSIS — Z309 Encounter for contraceptive management, unspecified: Secondary | ICD-10-CM

## 2012-12-26 DIAGNOSIS — Z3049 Encounter for surveillance of other contraceptives: Secondary | ICD-10-CM

## 2012-12-26 DIAGNOSIS — Z3202 Encounter for pregnancy test, result negative: Secondary | ICD-10-CM

## 2012-12-26 LAB — POCT URINE PREGNANCY: Preg Test, Ur: NEGATIVE

## 2012-12-26 MED ORDER — MEDROXYPROGESTERONE ACETATE 150 MG/ML IM SUSP
150.0000 mg | Freq: Once | INTRAMUSCULAR | Status: AC
  Administered 2012-12-26: 150 mg via INTRAMUSCULAR

## 2012-12-26 MED ORDER — MEDROXYPROGESTERONE ACETATE 150 MG/ML IM SUSP: 150.0000 mg | INTRAMUSCULAR | Status: DC

## 2012-12-27 ENCOUNTER — Encounter: Payer: Self-pay | Admitting: Obstetrics & Gynecology

## 2012-12-27 NOTE — Progress Notes (Signed)
Patient ID: Briana Nichols, female   DOB: 13-Jul-1986, 27 y.o.   MRN: [id number] Depo Prover 150 mg IM given

## 2013-03-12 ENCOUNTER — Encounter: Payer: Self-pay | Admitting: *Deleted

## 2013-03-13 ENCOUNTER — Encounter: Payer: Medicaid Other | Admitting: Adult Health

## 2013-10-02 ENCOUNTER — Emergency Department (HOSPITAL_COMMUNITY): Payer: Medicaid Other

## 2013-10-02 ENCOUNTER — Encounter (HOSPITAL_COMMUNITY): Payer: Self-pay | Admitting: Emergency Medicine

## 2013-10-02 ENCOUNTER — Emergency Department (HOSPITAL_COMMUNITY)
Admission: EM | Admit: 2013-10-02 | Discharge: 2013-10-02 | Disposition: A | Discharge status: Home / Self Care | Payer: Medicaid Other | Attending: Emergency Medicine | Admitting: Emergency Medicine

## 2013-10-02 DIAGNOSIS — X500XXA Overexertion from strenuous movement or load, initial encounter: Secondary | ICD-10-CM | POA: Insufficient documentation

## 2013-10-02 DIAGNOSIS — S9000XA Contusion of unspecified ankle, initial encounter: Secondary | ICD-10-CM | POA: Insufficient documentation

## 2013-10-02 DIAGNOSIS — Y939 Activity, unspecified: Secondary | ICD-10-CM | POA: Insufficient documentation

## 2013-10-02 DIAGNOSIS — Z8742 Personal history of other diseases of the female genital tract: Secondary | ICD-10-CM | POA: Insufficient documentation

## 2013-10-02 DIAGNOSIS — S93409A Sprain of unspecified ligament of unspecified ankle, initial encounter: Secondary | ICD-10-CM | POA: Insufficient documentation

## 2013-10-02 DIAGNOSIS — Z8632 Personal history of gestational diabetes: Secondary | ICD-10-CM | POA: Insufficient documentation

## 2013-10-02 DIAGNOSIS — Z8619 Personal history of other infectious and parasitic diseases: Secondary | ICD-10-CM | POA: Insufficient documentation

## 2013-10-02 DIAGNOSIS — Y929 Unspecified place or not applicable: Secondary | ICD-10-CM | POA: Insufficient documentation

## 2013-10-02 MED ORDER — IBUPROFEN 600 MG PO TABS: 600.0000 mg | ORAL_TABLET | Freq: Four times a day (QID) | ORAL | Status: DC | PRN

## 2013-10-02 NOTE — ED Provider Notes (Signed)
Medical screening examination/treatment/procedure(s) were performed by non-physician practitioner and as supervising physician I was immediately available for consultation/collaboration.  EKG Interpretation   None         Shelda JakesScott W. Stephane Niemann, MD 10/02/13 2114

## 2013-10-02 NOTE — ED Provider Notes (Signed)
CSN: [id number]     Arrival date & time 10/02/13  1859 History   First MD Initiated Contact with Patient 10/02/13 2004     Chief Complaint  Patient presents with  . Ankle Pain   (Consider location/radiation/quality/duration/timing/severity/associated sxs/prior Treatment) HPI Comments: Briana Nichols is a 28 y.o. Female presenting with right ankle pain which occurred when she tripped and inverted the ankle 2 days ago.  Painis  aching, constant and worse with palpation, movement and weight bearing.  The patient was able to weight bear immediately after the event.  There is no radiation of pain and the patient denies numbness distal to the injury site.  The patients treatment prior to arrival included tylenol.      The history is provided by the patient.    Past Medical History  Diagnosis Date  . Gestational diabetes     with first pregnancy  . Hx of chlamydia infection   . BV (bacterial vaginosis)    Past Surgical History  Procedure Laterality Date  . Cesarean section  2011  . Cervical cerclage  06/08/2012    Procedure: CERCLAGE CERVICAL;  Surgeon: Tilda Burrow, MD;  Location: [facility];  Service: Gynecology;  Laterality: N/A;   History reviewed. No pertinent family history. History  Substance Use Topics  . Smoking status: Never Smoker   . Smokeless tobacco: Never Used  . Alcohol Use: No   OB History   Grav Para Term Preterm Abortions TAB SAB Ect Mult Living   3 2 1 1 1  1   2      Review of Systems  Musculoskeletal: Positive for arthralgias and joint swelling.  Skin: Negative for wound.  Neurological: Negative for weakness and numbness.    Allergies  Review of patient's allergies indicates no known allergies.  Home Medications   Current Outpatient Rx  Name  Route  Sig  Dispense  Refill  . acetaminophen (TYLENOL) 500 MG tablet   Oral   Take 1,000 mg by mouth once as needed.         Marland Kitchen ibuprofen (ADVIL,MOTRIN) 600 MG tablet   Oral   Take 1 tablet (600 mg  total) by mouth every 6 (six) hours as needed.   30 tablet   0    BP 113/55  Pulse 80  Temp(Src) 99.1 F (37.3 C) (Oral)  Resp 20  Ht 5\' 6"  (1.676 m)  Wt 232 lb (105.235 kg)  BMI 37.46 kg/m2  SpO2 100%  LMP 09/13/2013 Physical Exam  Nursing note and vitals reviewed. Constitutional: She appears well-developed and well-nourished.  HENT:  Head: Normocephalic.  Cardiovascular: Normal rate and intact distal pulses.  Exam reveals no decreased pulses.   Pulses:      Dorsalis pedis pulses are 2+ on the right side, and 2+ on the left side.       Posterior tibial pulses are 2+ on the right side, and 2+ on the left side.  Musculoskeletal: She exhibits edema and tenderness.       Right ankle: She exhibits decreased range of motion, swelling and ecchymosis. She exhibits normal pulse. Tenderness. Medial malleolus tenderness found. No head of 5th metatarsal and no proximal fibula tenderness found. Achilles tendon normal.  Neurological: She is alert. No sensory deficit.  Skin: Skin is warm, dry and intact.    ED Course  Procedures (including critical care time) Labs Review Labs Reviewed - No data to display Imaging Review Dg Ankle Complete Right  10/02/2013   CLINICAL DATA:  Medial right ankle pain and swelling status post twisting injury 2 days ago.  EXAM: RIGHT ANKLE - COMPLETE 3+ VIEW  COMPARISON:  None.  FINDINGS: The ankle joint mortise is preserved. The talar dome appears normal. There is no evidence of an acute malleolar fracture. There is a tiny spur noted from the inferior aspect of the medial malleolus. There is mild diffuse soft tissue swelling especially medially. The talus and calcaneus appear intact.  IMPRESSION: No acute fracture of the right ankle is demonstrated. There is mild diffuse soft tissue swelling.   Electronically Signed   By: David  [place]azil[place]Jordan   On: 10/02/2013 20:30    EKG Interpretation   None       MDM   1. Ankle sprain and strain, right, initial encounter     ASO and crutches provided.  Cap refill normal after ASO applied.  RICE, referral to pcp if pain symptoms and swelling are not better over the next week.  Patients labs and/or radiological studies were viewed and considered during the medical decision making and disposition process. Burgess Amor.        Balraj Brayfield, PA-C 10/02/13 2111

## 2013-10-02 NOTE — ED Notes (Signed)
Patient reports fell two nights ago. complaining of right ankle pain.

## 2013-10-02 NOTE — ED Notes (Signed)
C/o bilateral ankle swelling but fell 2 nights ago and now has pain radiating from right ankle into calf

## 2013-10-02 NOTE — Discharge Instructions (Signed)
Ankle Sprain °An ankle sprain is an injury to the strong, fibrous tissues (ligaments) that hold the bones of your ankle joint together.  °CAUSES °An ankle sprain is usually caused by a fall or by twisting your ankle. Ankle sprains most commonly occur when you step on the outer edge of your foot, and your ankle turns inward. People who participate in sports are more prone to these types of injuries.  °SYMPTOMS  °· Pain in your ankle. The pain may be present at rest or only when you are trying to stand or walk. °· Swelling. °· Bruising. Bruising may develop immediately or within 1 to 2 days after your injury. °· Difficulty standing or walking, particularly when turning corners or changing directions. °DIAGNOSIS  °Your caregiver will ask you details about your injury and perform a physical exam of your ankle to determine if you have an ankle sprain. During the physical exam, your caregiver will press on and apply pressure to specific areas of your foot and ankle. Your caregiver will try to move your ankle in certain ways. An X-ray exam may be done to be sure a bone was not broken or a ligament did not separate from one of the bones in your ankle (avulsion fracture).  °TREATMENT  °Certain types of braces can help stabilize your ankle. Your caregiver can make a recommendation for this. Your caregiver may recommend the use of medicine for pain. If your sprain is severe, your caregiver may refer you to a surgeon who helps to restore function to parts of your skeletal system (orthopedist) or a physical therapist. °HOME CARE INSTRUCTIONS  °· Apply ice to your injury for 1 2 days or as directed by your caregiver. Applying ice helps to reduce inflammation and pain. °· Put ice in a plastic bag. °· Place a towel between your skin and the bag. °· Leave the ice on for 15-20 minutes at a time, every 2 hours while you are awake. °· Only take over-the-counter or prescription medicines for pain, discomfort, or fever as directed by  your caregiver. °· Elevate your injured ankle above the level of your heart as much as possible for 2 3 days. °· If your caregiver recommends crutches, use them as instructed. Gradually put weight on the affected ankle. Continue to use crutches or a cane until you can walk without feeling pain in your ankle. °· If you have a plaster splint, wear the splint as directed by your caregiver. Do not rest it on anything harder than a pillow for the first 24 hours. Do not put weight on it. Do not get it wet. You may take it off to take a shower or bath. °· You may have been given an elastic bandage to wear around your ankle to provide support. If the elastic bandage is too tight (you have numbness or tingling in your foot or your foot becomes cold and blue), adjust the bandage to make it comfortable. °· If you have an air splint, you may blow more air into it or let air out to make it more comfortable. You may take your splint off at night and before taking a shower or bath. Wiggle your toes in the splint several times per day to decrease swelling. °SEEK MEDICAL CARE IF:  °· You have rapidly increasing bruising or swelling. °· Your toes feel extremely cold or you lose feeling in your foot. °· Your pain is not relieved with medicine. °SEEK IMMEDIATE MEDICAL CARE IF: °· Your toes are numb   or blue.  You have severe pain that is increasing. MAKE SURE YOU:   Understand these instructions.  Will watch your condition.  Will get help right away if you are not doing well or get worse. Document Released: 09/13/2005 Document Revised: 06/07/2012 Document Reviewed: 09/25/2011 [facility] Patient Information 2014 [employer] [place].  Wear the ASO and use crutches to avoid weight bearing.  Use ice and elevation as much as possible for the next several days to help reduce the swelling.  Take the ibuprofen for pain and swelling.   Call your doctor for a recheck of your injury in 1 week.  You may benefit from physical therapy of your  ankle if  not getting better over the next week.

## 2013-11-21 ENCOUNTER — Encounter (HOSPITAL_COMMUNITY): Payer: Self-pay | Admitting: Emergency Medicine

## 2013-11-21 ENCOUNTER — Emergency Department (HOSPITAL_COMMUNITY)
Admission: EM | Admit: 2013-11-21 | Discharge: 2013-11-21 | Disposition: A | Discharge status: Home / Self Care | Payer: Medicaid Other | Attending: Emergency Medicine | Admitting: Emergency Medicine

## 2013-11-21 DIAGNOSIS — Z8632 Personal history of gestational diabetes: Secondary | ICD-10-CM | POA: Insufficient documentation

## 2013-11-21 DIAGNOSIS — R110 Nausea: Secondary | ICD-10-CM | POA: Insufficient documentation

## 2013-11-21 DIAGNOSIS — Z8619 Personal history of other infectious and parasitic diseases: Secondary | ICD-10-CM | POA: Insufficient documentation

## 2013-11-21 DIAGNOSIS — R002 Palpitations: Secondary | ICD-10-CM

## 2013-11-21 DIAGNOSIS — Z9889 Other specified postprocedural states: Secondary | ICD-10-CM | POA: Insufficient documentation

## 2013-11-21 DIAGNOSIS — Z8742 Personal history of other diseases of the female genital tract: Secondary | ICD-10-CM | POA: Insufficient documentation

## 2013-11-21 DIAGNOSIS — Z3490 Encounter for supervision of normal pregnancy, unspecified, unspecified trimester: Secondary | ICD-10-CM

## 2013-11-21 DIAGNOSIS — O9989 Other specified diseases and conditions complicating pregnancy, childbirth and the puerperium: Secondary | ICD-10-CM | POA: Insufficient documentation

## 2013-11-21 LAB — BASIC METABOLIC PANEL
BUN: 12 mg/dL (ref 6–23)
CALCIUM: 9.1 mg/dL (ref 8.4–10.5)
CO2: 25 meq/L (ref 19–32)
Chloride: 100 mEq/L (ref 96–112)
Creatinine, Ser: 0.82 mg/dL (ref 0.50–1.10)
GFR calc Af Amer: >90 mL/min (ref >90)
GFR calc non Af Amer: >90 mL/min (ref >90)
Glucose, Bld: 110 mg/dL — ABNORMAL HIGH (ref 70–99)
Potassium: 3.8 mEq/L (ref 3.7–5.3)
Sodium: 135 mEq/L — ABNORMAL LOW (ref 137–147)

## 2013-11-21 LAB — URINALYSIS, ROUTINE W REFLEX MICROSCOPIC
Bilirubin Urine: NEGATIVE
GLUCOSE, UA: NEGATIVE mg/dL
HGB URINE DIPSTICK: NEGATIVE
Ketones, ur: NEGATIVE mg/dL
Nitrite: NEGATIVE
PROTEIN: NEGATIVE mg/dL
Specific Gravity, Urine: >1.03 — ABNORMAL HIGH (ref 1.005–1.030)
Urobilinogen, UA: 1 mg/dL (ref 0.0–1.0)
pH: 6 (ref 5.0–8.0)

## 2013-11-21 LAB — CBC WITH DIFFERENTIAL/PLATELET
Basophils Absolute: 0 10*3/uL (ref 0.0–0.1)
Basophils Relative: 0 % (ref 0–1)
EOS PCT: 2 % (ref 0–5)
Eosinophils Absolute: 0.1 10*3/uL (ref 0.0–0.7)
HEMATOCRIT: 38.5 % (ref 36.0–46.0)
Hemoglobin: 13.4 g/dL (ref 12.0–15.0)
LYMPHS PCT: 29 % (ref 12–46)
Lymphs Abs: 1.9 10*3/uL (ref 0.7–4.0)
MCH: 30.9 pg (ref 26.0–34.0)
MCHC: 34.8 g/dL (ref 30.0–36.0)
MCV: 88.9 fL (ref 78.0–100.0)
MONO ABS: 0.4 10*3/uL (ref 0.1–1.0)
Monocytes Relative: 6 % (ref 3–12)
Neutro Abs: 4.2 10*3/uL (ref 1.7–7.7)
Neutrophils Relative %: 63 % (ref 43–77)
Platelets: 196 10*3/uL (ref 150–400)
RBC: 4.33 MIL/uL (ref 3.87–5.11)
RDW: 12.5 % (ref 11.5–15.5)
WBC: 6.6 10*3/uL (ref 4.0–10.5)

## 2013-11-21 LAB — URINE MICROSCOPIC-ADD ON

## 2013-11-21 LAB — POC URINE PREG, ED: Preg Test, Ur: POSITIVE — AB

## 2013-11-21 MED ORDER — SODIUM CHLORIDE 0.9 % IV SOLN
1000.0000 mL | Freq: Once | INTRAVENOUS | Status: AC
  Administered 2013-11-21: 1000 mL via INTRAVENOUS

## 2013-11-21 MED ORDER — ONDANSETRON HCL 4 MG/2ML IJ SOLN
4.0000 mg | Freq: Once | INTRAMUSCULAR | Status: AC
  Administered 2013-11-21: 4 mg via INTRAVENOUS
  Filled 2013-11-21: qty 2

## 2013-11-21 MED ORDER — SODIUM CHLORIDE 0.9 % IV SOLN: 1000.0000 mL | INTRAVENOUS | Status: DC

## 2013-11-21 MED ORDER — ONDANSETRON HCL 4 MG PO TABS: 4.0000 mg | ORAL_TABLET | Freq: Four times a day (QID) | ORAL | Status: DC | PRN

## 2013-11-21 MED ORDER — PRENATAL COMPLETE 14-0.4 MG PO TABS: 1.0000 | ORAL_TABLET | Freq: Every day | ORAL | Status: DC

## 2013-11-21 NOTE — Discharge Instructions (Signed)
Make arrangements for prenatal care.  Pregnancy If you are planning on getting pregnant, it is a good idea to make a preconception appointment with your caregiver to discuss having a healthy lifestyle before getting pregnant. This includes diet, weight, exercise, taking prenatal vitamins (especially folic acid, which helps prevent brain and spinal cord defects), avoiding alcohol, smoking and illegal drugs, medical problems (diabetes, convulsions), family history of genetic problems, working conditions, and immunizations. It is better to have knowledge of these things and do something about them before getting pregnant. During your pregnancy, it is important to follow certain guidelines in order to have a healthy baby. It is very important to get good prenatal care and follow your caregiver's instructions. Prenatal care includes all the medical care you receive before your baby's birth. This helps to prevent problems during the pregnancy and childbirth. HOME CARE INSTRUCTIONS   Start your prenatal visits by the 12th week of pregnancy or earlier, if possible. At first, appointments are usually scheduled monthly. They become more frequent in the last 2 months before delivery. It is important that you keep your caregiver's appointments and follow your caregiver's instructions regarding medication use, exercise, and diet.  During pregnancy, you are providing food for you and your baby. Eat a regular, well-balanced diet. Choose foods such as meat, fish, milk and other dairy products, vegetables, fruits, whole-grain breads and cereals. Your caregiver will inform you of the ideal weight gain depending on your current height and weight. Drink lots of liquids. Try to drink 8 glasses of water a day.  Alcohol is associated with a number of birth defects including fetal alcohol syndrome. It is best to avoid alcohol completely. Smoking will cause low birth rate and prematurity. Use of alcohol and nicotine during your  pregnancy also increases the chances that your child will be chemically dependent later in their life and may contribute to SIDS (Sudden Infant Death Syndrome).  Do not use illegal drugs.  Only take prescription or over-the-counter medications that are recommended by your caregiver. Other medications can cause genetic and physical problems in the baby.  Morning sickness can often be helped by keeping soda crackers at the bedside. Eat a few before getting up in the morning.  A sexual relationship may be continued until near the end of pregnancy if there are no other problems such as early (premature) leaking of amniotic fluid from the membranes, vaginal bleeding, painful intercourse or belly (abdominal) pain.  Exercise regularly. Check with your caregiver if you are unsure of the safety of some of your exercises.  Do not use hot tubs, steam rooms or saunas. These increase the risk of fainting and hurting yourself and the baby. Swimming is OK for exercise. Get plenty of rest, including afternoon naps when possible, especially in the third trimester.  Avoid toxic odors and chemicals.  Do not wear high heels. They may cause you to lose your balance and fall.  Do not lift over 5 pounds. If you do lift anything, lift with your legs and thighs, not your back.  Avoid long trips, especially in the third trimester.  If you have to travel out of the city or state, take a copy of your medical records with you. SEEK IMMEDIATE MEDICAL CARE IF:   You develop an unexplained oral temperature above 102 F (38.9 C), or as your caregiver suggests.  You have leaking of fluid from the vagina. If leaking membranes are suspected, take your temperature and inform your caregiver of this when you  call.  There is vaginal spotting or bleeding. Notify your caregiver of the amount and how many pads are used.  You continue to feel sick to your stomach (nauseous) and have no relief from remedies suggested, or you  throw up (vomit) blood or coffee ground like materials.  You develop upper abdominal pain.  You have round ligament discomfort in the lower abdominal area. This still must be evaluated by your caregiver.  You feel contractions of the uterus.  You do not feel the baby move, or there is less movement than before.  You have painful urination.  You have abnormal vaginal discharge.  You have persistent diarrhea.  You get a severe headache.  You have problems with your vision.  You develop muscle weakness.  You feel dizzy and faint.  You develop shortness of breath.  You develop chest pain.  You have back pain that travels down to your leg and feet.  You feel irregular or a very fast heartbeat.  You develop excessive weight gain in a short period of time (5 pounds in 3 to 5 days).  You are involved in a domestic violence situation. Document Released: 09/13/2005 Document Revised: 03/14/2012 Document Reviewed: 03/07/2009 [facility] Patient Information 2014 [place], [place].  Nausea, Adult Nausea is the feeling that you have an upset stomach or have to vomit. Nausea by itself is not likely a serious concern, but it may be an early sign of more serious medical problems. As nausea gets worse, it can lead to vomiting. If vomiting develops, there is the risk of dehydration.  CAUSES   Viral infections.  Food poisoning.  Medicines.  Pregnancy.  Motion sickness.  Migraine headaches.  Emotional distress.  Severe pain from any source.  Alcohol intoxication. HOME CARE INSTRUCTIONS  Get plenty of rest.  Ask your caregiver about specific rehydration instructions.  Eat small amounts of food and sip liquids more often.  Take all medicines as told by your caregiver. SEEK MEDICAL CARE IF:  You have not improved after 2 days, or you get worse.  You have a headache. SEEK IMMEDIATE MEDICAL CARE IF:   You have a fever.  You faint.  You keep vomiting or have blood in  your vomit.  You are extremely weak or dehydrated.  You have dark or bloody stools.  You have severe chest or abdominal pain. MAKE SURE YOU:  Understand these instructions.  Will watch your condition.  Will get help right away if you are not doing well or get worse. Document Released: 10/21/2004 Document Revised: 06/07/2012 Document Reviewed: 05/26/2011 [facility] Patient Information 2014 [place], [place].  Palpitations  A palpitation is the feeling that your heartbeat is irregular or is faster than normal. It may feel like your heart is fluttering or skipping a beat. Palpitations are usually not a serious problem. However, in some cases, you may need further medical evaluation. CAUSES  Palpitations can be caused by:  Smoking.  Caffeine or other stimulants, such as diet pills or energy drinks.  Alcohol.  Stress and anxiety.  Strenuous physical activity.  Fatigue.  Certain medicines.  Heart disease, especially if you have a history of arrhythmias. This includes atrial fibrillation, atrial flutter, or supraventricular tachycardia.  An improperly working pacemaker or defibrillator. DIAGNOSIS  To find the cause of your palpitations, your caregiver will take your history and perform a physical exam. Tests may also be done, including:  Electrocardiography (ECG). This test records the heart's electrical activity.  Cardiac monitoring. This allows your caregiver to monitor  your heart rate and rhythm in real time.  Holter monitor. This is a portable device that records your heartbeat and can help diagnose heart arrhythmias. It allows your caregiver to track your heart activity for several days, if needed.  Stress tests by exercise or by giving medicine that makes the heart beat faster. TREATMENT  Treatment of palpitations depends on the cause of your symptoms and can vary greatly. Most cases of palpitations do not require any treatment other than time, relaxation, and monitoring  your symptoms. Other causes, such as atrial fibrillation, atrial flutter, or supraventricular tachycardia, usually require further treatment. HOME CARE INSTRUCTIONS   Avoid:  Caffeinated coffee, tea, soft drinks, diet pills, and energy drinks.  Chocolate.  Alcohol.  Stop smoking if you smoke.  Reduce your stress and anxiety. Things that can help you relax include:  A method that measures bodily functions so you can learn to control them (biofeedback).  Yoga.  Meditation.  Physical activity such as swimming, jogging, or walking.  Get plenty of rest and sleep. SEEK MEDICAL CARE IF:   You continue to have a fast or irregular heartbeat beyond 24 hours.  Your palpitations occur more often. SEEK IMMEDIATE MEDICAL CARE IF:  You develop chest pain or shortness of breath.  You have a severe headache.  You feel dizzy, or you faint. MAKE SURE YOU:  Understand these instructions.  Will watch your condition.  Will get help right away if you are not doing well or get worse. Document Released: 09/10/2000 Document Revised: 01/08/2013 Document Reviewed: 11/12/2011 [facility] Patient Information 2014 [place], [place].  Ondansetron tablets What is this medicine? ONDANSETRON (on DAN se tron) is used to treat nausea and vomiting caused by chemotherapy. It is also used to prevent or treat nausea and vomiting after surgery. This medicine may be used for other purposes; ask your health care provider or pharmacist if you have questions. COMMON BRAND NAME(S): Zofran What should I tell my health care provider before I take this medicine? They need to know if you have any of these conditions: -heart disease -history of irregular heartbeat -liver disease -low levels of magnesium or potassium in the blood -an unusual or allergic reaction to ondansetron, granisetron, other medicines, foods, dyes, or preservatives -pregnant or trying to get pregnant -breast-feeding How should I use this  medicine? Take this medicine by mouth with a glass of water. Follow the directions on your prescription label. Take your doses at regular intervals. Do not take your medicine more often than directed. Talk to your pediatrician regarding the use of this medicine in children. Special care may be needed. Overdosage: If you think you have taken too much of this medicine contact a poison control center or emergency room at once. NOTE: This medicine is only for you. Do not share this medicine with others. What if I miss a dose? If you miss a dose, take it as soon as you can. If it is almost time for your next dose, take only that dose. Do not take double or extra doses. What may interact with this medicine? Do not take this medicine with any of the following medications: -apomorphine -certain medicines for fungal infections like fluconazole, itraconazole, ketoconazole, posaconazole, voriconazole -cisapride -dofetilide -dronedarone -pimozide -thioridazine -ziprasidone  This medicine may also interact with the following medications: -carbamazepine -certain medicines for depression, anxiety, or psychotic disturbances -fentanyl -linezolid -MAOIs like Carbex, Eldepryl, Marplan, Nardil, and Parnate -methylene blue (injected into a vein) -other medicines that prolong the QT interval (cause  an abnormal heart rhythm) -phenytoin -rifampicin -tramadol This list may not describe all possible interactions. Give your health care provider a list of all the medicines, herbs, non-prescription drugs, or dietary supplements you use. Also tell them if you smoke, drink alcohol, or use illegal drugs. Some items may interact with your medicine. What should I watch for while using this medicine? Check with your doctor or health care professional right away if you have any sign of an allergic reaction. What side effects may I notice from receiving this medicine? Side effects that you should report to your doctor or  health care professional as soon as possible: -allergic reactions like skin rash, itching or hives, swelling of the face, lips or tongue -breathing problems -confusion -dizziness -fast or irregular heartbeat -feeling faint or lightheaded, falls -fever and chills -loss of balance or coordination -seizures -sweating -swelling of the hands or feet -tightness in the chest -tremors -unusually weak or tired Side effects that usually do not require medical attention (report to your doctor or health care professional if they continue or are bothersome): -constipation or diarrhea -headache This list may not describe all possible side effects. Call your doctor for medical advice about side effects. You may report side effects to FDA at 1-800-FDA-1088. Where should I keep my medicine? Keep out of the reach of children. Store between 2 and 30 degrees C (36 and 86 degrees F). Throw away any unused medicine after the expiration date. NOTE: This sheet is a summary. It may not cover all possible information. If you have questions about this medicine, talk to your doctor, pharmacist, or health care provider.  2014, Elsevier/Gold Standard. (2013-06-20 16:27:45)  Prenatal Vitamin and Mineral Combinations (oral solid dosage forms) What is this medicine? PRENATAL VITAMIN AND MINERAL combinations are used before, during, and after pregnancy to help provide provide good nutrition. This medicine may be used for other purposes; ask your health care provider or pharmacist if you have questions. COMMON BRAND NAME(S): Active OB, Advanced Care Plus, Advanced NatalCare, Advanced-RF NatalCare, Aminate Fe, Anemagen OB, B-Nexa, BP FoliNatal Plus B, BP MultiNatal Plus , BP MultiNatal Plus Chewable, BP Prenate, Brainstrong, Bright Beginnings Prenatal, Cal-Nate , CareNatal DHA, CareNate 600 , Cavan One Omega , Cavan Prenatal with EC Calcium , Cavan-Alpha , Cavan-EC SOD DHA, Cavan-Heme OB, Cavan-Heme Omega, Cenogen Ultra,  Centrum Specialist Prenatal, CertaVite with Antioxidants, Choice-OB + DHA, Citracal Prenatal + DHA, Citracal Prenatal, CitraNatal 90 DHA, CitraNatal Assure, CitraNatal B-Calm, CitraNatal DHA, CitraNatal Harmony, CitraNatal Rx, Classic Prenatal, ComBi Rx , Complete Natal DHA, Complete-RF , CompleteNate, Concept DHA, Concept OB, CoreNate-DHA, Corvite FE with Quatrefolic, CRNatal DHA, Daily Vitamin , Docosavit, Duet Chewable, Duet DHA 400, Duet DHA 430ec, Duet DHA Balanced, Duet DHA Complete Gluten Free, Duet DHA Complete, Duet DHA EC, Duet DHA Ferrazone, EC Omega-3, Duet DHA, Duet, DuoVit DHA, Edge OB, Elite OB , Elite OB with DHA, Elite-OB 400, Extra-Virt Plus, Femecal OB Plus DHA, Femecal OB, Ferrocite Plus, Folbecal , Folcal DHA , Folcaps Care One, [address] , [place], Folivane-EC Calcium DHA NF, Foltabs 90 Plus DHA, Foltabs Prenatal Plus DHA, Foltabs Prenatal, Gesticare DHA , Gesticare DHA Delayed-Release , Gesticare, HemeNatal OB + DHA, HemeNatal OB, Hemocyte Plus, HIP Prenatal, ICAR Prenatal Rx, iNatal Advance , iNatal GT, iNatal Ultra, Infanate Balance, Infanate DHA , Kolnatal , Lactocal-F, Levomefolate PNV, MACNATAL CN DHA, Marnatal-F , Marnatal-F Plus, Materna, Maxinate, Mission Prenatal , Mission Prenatal F.A., Mission Prenatal H.P., Mom's Choice Rx, Multi-Nate 30 , Multi-Nate 30 DHA ,  Multi-Nate DHA Extra, Multifol Plus, Multivitamin With Minerals , MyNatal OB Prenatal, NataCaps, NataChew, NataFolic-OB, Natafort, Natal-V RX, NatalCare CFe 60, NatalCare GlossTabs, [address] PIC , C.H. Robinson WorldwideatalCare PIC Forte , United ParcelatalCare Plus , Schering-PloughatalCare Rx, [address], NATALVIRT 90 DHA, NATALVIRT CA, NataTab CFe, NataTab FA, Natatab Rx , Natelle C, Natelle One, Hess Corporationatelle Plus with DHA, Natelle Prefer, Natelle, Natelle-ez, Navatab + DHA, Neevo DHA , Neevo, Nestabs ABC, Nestabs CBF, Nestabs DHA, Nestabs FA, Nestabs Rx, Nestabs, New Advanced Formula Prenatal Z , Nexa Plus, Nexa Select, Niferex-PN Forte, Niferex-PN,  NovaNatal, NovaStart, Nu-Natal , NutraCare , Nutri-Tab OB + DHA, Nutri-Tab OB, Nutrinate , NutriSpire, O-Cal F.A. , O-Cal Prenatal , OB Choice, OB Complete 400, OB Complete One , OB Complete Petite, OB Complete Premier, OB Complete with DHA , OB Complete, OB-Natal One, Obstetrix-100 , Obtrex DHA, Obtrex, One-A-Day Women's Prenatal, One-A-Day Women's, OptiNate, Paire OB Tablet Plus DHA, PNV OB + DHA, PNV Prenatal Plus Multivitamin, PNV Prenatal, PNV-DHA + Docusate, PNV-DHA , PNV-DHA Plus , PNV-First, PNV-Iron, PNV-OB with DHA, PNV-Omega, PNV-Select, PNV-Total with DHA , PR Serbiaatal 400, PR Serbiaatal 400ec, PR [address] 430, PR [address] 430ec, PR Serbiaatal 440ec, PreCare, PreferaOB + DHA, PreferaOB One, PreferaOB, Premesis Rx, [address]ARL, [address] Plus, [address], DotyvillePrenafirst, Prenaissance 90 DHA, Prenaissance Balance, Prenaissance DHA, Prenaissance Harmony DHA, Prenaissance Next, Prenaissance Plus , Prenaissance Promise, Prenaissance, PrenaPlus, Prenat with Quatrefolic, PreNata Multivitamin with Iron, Prenatabs CBF, Prenatabs FA, Prenatabs OBN, Prenatabs RX, Prenatal 1 Plus 1 , Prenatal 19, Prenatal AD, Prenatal Formula 3, Prenatal H, Prenatal Low Iron, Prenatal MR 6390 Fe, Prenatal MTR with Selenium, Prenatal Optima Advance , Prenatal Plus Iron, Prenatal Plus, Prenatal Rx with Beta Carotene, Prenatal S, Prenatal U , Prenatal Vitamin , PreNatal Vitamins Plus , Prenatal, Prenate Advance, Prenate DHA, Prenate Elite, Prenate Enhance with Massachusetts Mutual LifeQuatrefolic, Prenate Essential , Prenate GT, Prenate Mini, PreNate Plus, Prenate Restore with Quatrefolic, Prenate Ultra, Prenavite , Prenavite Protein , PreNexa Premier , PreNexa, PreQue, Previte Rx , Rockwell AutomationPrimaCare Advantage, PrimaCare ONE, PrimaCare, Provida OB, PruEt DHA , PruEt DHAec, PureFe OB Plus , PureFe Plus, PureVit DualFe Plus , RE DualVit OB , RE DualVit Plus, RE OB + DHA, RE OB 90 + DHA, RE Prenatal, RE PreVit + DHA , RE-Nata 29 OB, RE-Nata 29, Reaphrim, Renate DHA Extra, Renate DHA, Renate,  REocyte Plus, Right Step, Rovin-Nv DHA, Rovin-Nv, Se-Care Conceive, Kelly ServicesSe-Care Gesture, FairviewSe-Care, Se-Natal 19 , Se-Natal 19 Chewable, Se-Natal 90, Se-Natal ONE, Se-Plete DHA , Se-Tan DHA, Se-Tan Plus, Select-OB + DHA, Select-OB, SetonET, SetonET-EC DHA, StrongStart Chewable Tablet, StrongStart, Stuart One, Stuart Prenatal + DHA, Stuart Prenatal, Stuartnatal Plus 3, Tandem DHA, Tandem OB, Tandem Plus, Taron A Prenatal Pack with DHA, Taron EC Calcium DHA Pack, Taron Prenatal with DHA, Taron-C DHA, Taron-EC Cal , Taron-Prex Prenatal with DHA, Thera Serbiaatal Complete, Thera Serbiaatal Core Nutrition, Thera Serbiaatal Lactation Support, Thera-Tabs, TL-Care DHA, TL-Select , TL-Select DHA, Tri Rx, TriAdvance, TriCare Prenatal DHA ONE , TriCare, Trifera OB, Trimesis Rx, Trinatal GT, Cooksvillerinatal Rx 1 , Trinate , Triveen-Duo DHA, Triveen-PRx RNF, Triveen-Ten, Trust Harley-Davidsonatal DHA, UltimateCare Advantage, EconomistUltimateCare Combo, UltimateCare ONE NF, UltimateCare ONE, Coca ColaUltra NatalCare , VemaVite-PRx 2, Vena-Bal DHA, Verotin-BY, Verotin-GR, Vinacal B, Vinacal, Vinatal Forte, Vinate 90, Vinate AZ , Vinate AZ Extra, Vinate C , Vinate Calcium, Vinate Care, Vinate DHA, Vinate GT, Vinate IC , Vinate II, Vinate III, Vinate M Low Iron, Vinate One, Vinate PN, Vinate Ultra, Virt-PN DHA, Virt-PN Plus, Virt-PN, Virt-Select, Vitafol PN, Vitafol Ultra, Vitafol-Nano Prenatal, Vitafol-OB + DHA, Vitafol-OB and DHA,  Vitafol-OB, Vitafol-One , VitaMed MD Plus Rx , VitaNatal OB Plus DHA, vitaPearl Prenatal, VitaPhil + DHA 90, VitaPhil + DHA, VitaPhil AiDE , VitaPhil, VitaSpire, Viva CT, VIVA DHA, Vol-Tab Rx, VP-CH-PNV, VP-GGR-B6 Prenatal, VP-PNV-DHA, Zatean-CH, Zatean-Pn DHA, Zatean-Pn Plus, Zatean-Pn, Zingiber What should I tell my health care provider before I take this medicine? They need to know if you have any of these conditions: -bleeding or clotting disorder -history of anemia of any type -other chronic health condition -an unusual or allergic reaction to  vitamins, minerals, other medicines, foods, dyes, or preservatives How should I use this medicine? Take this medicine by mouth with a glass of water. You can take it with or without food. If it upsets your stomach, take it with food. Chewable prenatal vitamin tablets may be chewed completely before swallowing. Follow the directions on the prescription label. The usual dose is taken once a day. Do not take your medicine more often than directed. Contact your pediatrician regarding the use of this medicine in children. Special care may be needed. This medicine is intended for females who are pregnant, breast-feeding, or may become pregnant. Overdosage: If you think you have taken too much of this medicine contact a poison control center or emergency room at once. NOTE: This medicine is only for you. Do not share this medicine with others. What if I miss a dose? If you miss a dose, take it as soon as you can. If it is almost time for your next dose, take only that dose. Do not take double or extra doses. What may interact with this medicine? -alendronate -antacids -cefdinir -cefditoren -etidronate -fluoroquinolone antibiotics (examples: ciprofloxacin, gatifloxacin, levofloxacin) -ibandronate -levodopa -risedronate -tetracycline antibiotics (examples: doxycycline, minocycline, tetracycline) -thyroid hormones -warfarin This list may not describe all possible interactions. Give your health care provider a list of all the medicines, herbs, non-prescription drugs, or dietary supplements you use. Also tell them if you smoke, drink alcohol, or use illegal drugs. Some items may interact with your medicine. What should I watch for while using this medicine? See your health care professional for regular checks on your progress. Remember that vitamin and mineral supplements do not replace the need for good nutrition from a balanced diet. Stools commonly change color when vitamins and minerals are taken.  Notify your health care professional if this change is alarming or accompanied by other symptoms, like abdominal pain. What side effects may I notice from receiving this medicine? Side effects that you should report to your doctor or health care professional as soon as possible: -allergic reaction such as skin rash or difficulty breathing -vomiting Side effects that usually do not require medical attention (report to your doctor or health care professional if they continue or are bothersome): -nausea -stomach upset This list may not describe all possible side effects. Call your doctor for medical advice about side effects. You may report side effects to FDA at 1-800-FDA-1088. Where should I keep my medicine? Keep out of the reach of children. Most vitamins and minerals should be stored at controlled room temperature. Check your specific product directions. Protect from heat and moisture. Throw away any unused medicine after the expiration date. NOTE: This sheet is a summary. It may not cover all possible information. If you have questions about this medicine, talk to your doctor, pharmacist, or health care provider.  2014, Elsevier/Gold Standard. (2011-07-23 16:48:05)

## 2013-11-21 NOTE — ED Notes (Signed)
Patient states has been under a lot of stress starting a new job and states her heart skips beats when she's stressed.  Also c/o nausea x 1 week.

## 2013-11-21 NOTE — ED Provider Notes (Signed)
CSN: [id number]     Arrival date & time 11/21/13  0045 History   First MD Initiated Contact with Patient 11/21/13 0047     Chief Complaint  Patient presents with  . Palpitations     (Consider location/radiation/quality/duration/timing/severity/associated sxs/prior Treatment) Patient is a 28 y.o. female presenting with palpitations. The history is provided by the patient.  Palpitations She has had nausea for the last week and vomited once yesterday. Also, yesterday, she noted her heart was fluttering and skipping beats and sometimes solid was beating out of her chest. Denies any dyspnea. Denies fever or chills. She denies chest pain. There has been no constipation or diarrhea. She denies arthralgias or myalgias. A special. Was January 20. She uses condoms for contraception but not with every episode of intercourse. She denies urinary frequency, breast swelling, breast tenderness. She relates that she started a new job which involves working his shift that she does not normally work and she wonders if that might be responsible for some of her symptoms.  Past Medical History  Diagnosis Date  . Gestational diabetes     with first pregnancy  . Hx of chlamydia infection   . BV (bacterial vaginosis)    Past Surgical History  Procedure Laterality Date  . Cesarean section  2011  . Cervical cerclage  06/08/2012    Procedure: CERCLAGE CERVICAL;  Surgeon: Tilda Burrow, MD;  Location: [facility];  Service: Gynecology;  Laterality: N/A;   No family history on file. History  Substance Use Topics  . Smoking status: Never Smoker   . Smokeless tobacco: Never Used  . Alcohol Use: No   OB History   Grav Para Term Preterm Abortions TAB SAB Ect Mult Living   3 2 1 1 1  1   2      Review of Systems  Cardiovascular: Positive for palpitations.  All other systems reviewed and are negative.      Allergies  Review of patient's allergies indicates no known allergies.  Home Medications   Current  Outpatient Rx  Name  Route  Sig  Dispense  Refill  . acetaminophen (TYLENOL) 500 MG tablet   Oral   Take 1,000 mg by mouth once as needed.         Marland Kitchen ibuprofen (ADVIL,MOTRIN) 600 MG tablet   Oral   Take 1 tablet (600 mg total) by mouth every 6 (six) hours as needed.   30 tablet   0    BP 121/63  Pulse 88  Temp(Src) 98.2 F (36.8 C) (Oral)  Resp 18  Ht 5\' 6"  (1.676 m)  Wt 249 lb (112.946 kg)  BMI 40.21 kg/m2  SpO2 100%  LMP 10/16/2013 Physical Exam  Nursing note and vitals reviewed.  28 year old female, resting comfortably and in no acute distress. Vital signs are normal. Oxygen saturation is 100%, which is normal. Head is normocephalic and atraumatic. PERRLA, EOMI. Oropharynx is clear. Neck is nontender and supple without adenopathy or JVD. Back is nontender and there is no CVA tenderness. Lungs are clear without rales, wheezes, or rhonchi. Chest is nontender. Heart has regular rate and rhythm without murmur. Abdomen is soft, flat, nontender without masses or hepatosplenomegaly and peristalsis is normoactive. Extremities have no cyanosis or edema, full range of motion is present. Skin is warm and dry without rash. Neurologic: Mental status is normal, cranial nerves are intact, there are no motor or sensory deficits.  ED Course  Procedures (including critical care time) Labs Review Results for  orders placed during the hospital encounter of 11/21/13  URINALYSIS, ROUTINE W REFLEX MICROSCOPIC      Result Value Ref Range   Color, Urine YELLOW  YELLOW   APPearance CLEAR  CLEAR   Specific Gravity, Urine >1.030 (*) 1.005 - 1.030   pH 6.0  5.0 - 8.0   Glucose, UA NEGATIVE  NEGATIVE mg/dL   Hgb urine dipstick NEGATIVE  NEGATIVE   Bilirubin Urine NEGATIVE  NEGATIVE   Ketones, ur NEGATIVE  NEGATIVE mg/dL   Protein, ur NEGATIVE  NEGATIVE mg/dL   Urobilinogen, UA 1.0  0.0 - 1.0 mg/dL   Nitrite NEGATIVE  NEGATIVE   Leukocytes, UA TRACE (*) NEGATIVE  CBC WITH DIFFERENTIAL       Result Value Ref Range   WBC 6.6  4.0 - 10.5 K/uL   RBC 4.33  3.87 - 5.11 MIL/uL   Hemoglobin 13.4  12.0 - 15.0 g/dL   HCT 16.1  09.6 - 04.5 %   MCV 88.9  78.0 - 100.0 fL   MCH 30.9  26.0 - 34.0 pg   MCHC 34.8  30.0 - 36.0 g/dL   RDW 40.9  81.1 - 91.4 %   Platelets 196  150 - 400 K/uL   Neutrophils Relative % 63  43 - 77 %   Neutro Abs 4.2  1.7 - 7.7 K/uL   Lymphocytes Relative 29  12 - 46 %   Lymphs Abs 1.9  0.7 - 4.0 K/uL   Monocytes Relative 6  3 - 12 %   Monocytes Absolute 0.4  0.1 - 1.0 K/uL   Eosinophils Relative 2  0 - 5 %   Eosinophils Absolute 0.1  0.0 - 0.7 K/uL   Basophils Relative 0  0 - 1 %   Basophils Absolute 0.0  0.0 - 0.1 K/uL  BASIC METABOLIC PANEL      Result Value Ref Range   Sodium 135 (*) 137 - 147 mEq/L   Potassium 3.8  3.7 - 5.3 mEq/L   Chloride 100  96 - 112 mEq/L   CO2 25  19 - 32 mEq/L   Glucose, Bld 110 (*) 70 - 99 mg/dL   BUN 12  6 - 23 mg/dL   Creatinine, Ser 7.82  0.50 - 1.10 mg/dL   Calcium 9.1  8.4 - 95.6 mg/dL   GFR calc non Af Amer >90  >90 mL/min   GFR calc Af Amer >90  >90 mL/min  URINE MICROSCOPIC-ADD ON      Result Value Ref Range   Squamous Epithelial / LPF MANY (*) RARE   WBC, UA 3-6  <3 WBC/hpf   RBC / HPF 0-2  <3 RBC/hpf   Bacteria, UA MANY (*) RARE   Urine-Other MUCOUS PRESENT    POC URINE PREG, ED      Result Value Ref Range   Preg Test, Ur POSITIVE (*) NEGATIVE   MDM   Final diagnoses:  Pregnancy  Nausea  Palpitations    Nausea of uncertain cause. We'll need to rule out pregnancy. Subjective palpitations with normal ECG. She will be kept on cardiac monitor. Meantime, she will be given some IV fluids and IV ondansetron. Electrolytes will be checked. Old records are reviewed and are no relevant past visits.  Pregnancy test is come back positive which accounts for her her nausea. Electrolytes were significant only for borderline hyponatremia which is not of any clinical significance. Urinalysis does have many bacteria  but also many squamous epithelial cells indicating a contaminated  specimen. She does not have any urinary symptoms, so she will not be placed on antibiotics. She is given prescriptions for prenatal vitamins and ondansetron and is referred back to her PCP. She is advised to make arrangements for prenatal care.  Dione Boozeavid Stellah Donovan, MD 11/21/13 [phone]

## 2013-11-21 NOTE — ED Notes (Signed)
Discharge instructions and prescriptions given and reviewed with patient.  Patient verbalized understanding to establish prenatal care.  Patient ambulatory; discharged home in good condition.

## 2013-11-26 ENCOUNTER — Encounter (INDEPENDENT_AMBULATORY_CARE_PROVIDER_SITE_OTHER): Payer: Self-pay

## 2013-11-26 ENCOUNTER — Ambulatory Visit (INDEPENDENT_AMBULATORY_CARE_PROVIDER_SITE_OTHER): Payer: Medicaid Other | Admitting: Adult Health

## 2013-11-26 ENCOUNTER — Encounter: Payer: Self-pay | Admitting: Adult Health

## 2013-11-26 VITALS — BP 118/60 | Ht 66.0 in | Wt 245.8 lb

## 2013-11-26 DIAGNOSIS — Z3201 Encounter for pregnancy test, result positive: Secondary | ICD-10-CM

## 2013-11-26 NOTE — Progress Notes (Signed)
Patient ID: Briana Nichols, female   DOB: 1985/12/29, 28 y.o.   MRN: [id number] Pt here for pregnancy test, resulted positive. Pt states unsure if she is going to keep pregnancy. Pt to return as soon as possible for ultrasound and appt with Cyril MourningJennifer Griffin, NP to discuss termination options.

## 2013-11-27 LAB — POCT URINE PREGNANCY: PREG TEST UR: POSITIVE

## 2013-11-30 ENCOUNTER — Other Ambulatory Visit: Payer: Self-pay | Admitting: Obstetrics & Gynecology

## 2013-11-30 DIAGNOSIS — O3680X0 Pregnancy with inconclusive fetal viability, not applicable or unspecified: Secondary | ICD-10-CM

## 2013-12-03 ENCOUNTER — Ambulatory Visit (INDEPENDENT_AMBULATORY_CARE_PROVIDER_SITE_OTHER): Payer: Medicaid Other

## 2013-12-03 ENCOUNTER — Ambulatory Visit (INDEPENDENT_AMBULATORY_CARE_PROVIDER_SITE_OTHER): Payer: Medicaid Other | Admitting: Adult Health

## 2013-12-03 ENCOUNTER — Other Ambulatory Visit: Payer: Self-pay | Admitting: Obstetrics & Gynecology

## 2013-12-03 ENCOUNTER — Encounter: Payer: Self-pay | Admitting: Adult Health

## 2013-12-03 VITALS — BP 120/80 | Ht 66.0 in | Wt 238.0 lb

## 2013-12-03 DIAGNOSIS — N83209 Unspecified ovarian cyst, unspecified side: Secondary | ICD-10-CM | POA: Insufficient documentation

## 2013-12-03 DIAGNOSIS — O34219 Maternal care for unspecified type scar from previous cesarean delivery: Secondary | ICD-10-CM

## 2013-12-03 DIAGNOSIS — Z3490 Encounter for supervision of normal pregnancy, unspecified, unspecified trimester: Secondary | ICD-10-CM | POA: Insufficient documentation

## 2013-12-03 DIAGNOSIS — O09299 Supervision of pregnancy with other poor reproductive or obstetric history, unspecified trimester: Secondary | ICD-10-CM

## 2013-12-03 DIAGNOSIS — O26849 Uterine size-date discrepancy, unspecified trimester: Secondary | ICD-10-CM

## 2013-12-03 DIAGNOSIS — Z640 Problems related to unwanted pregnancy: Secondary | ICD-10-CM

## 2013-12-03 DIAGNOSIS — O3680X0 Pregnancy with inconclusive fetal viability, not applicable or unspecified: Secondary | ICD-10-CM

## 2013-12-03 HISTORY — DX: Unspecified ovarian cyst, unspecified side: N83.209

## 2013-12-03 NOTE — Progress Notes (Signed)
Subjective:     Patient ID: Marygrace DroughtSantia Rummell, female   DOB: 1986-06-14, 28 y.o.   MRN: [id number]  HPI Laurice RecordSantia is a 28 year old black female in for US to date pregnancy, she does not want to continue the pregnancy.She said she used a condom.  Review of Systems See HPI Reviewed past medical,surgical, social and family history. Reviewed medications and allergies.     Objective:   Physical Exam BP 120/80  Ht 5\' 6"  (1.676 m)  Wt 238 lb (107.956 kg)  BMI 38.43 kg/m2  LMP 10/14/2013   US reviewed with pt, has IUP 8+1 week EDC 07/14/14, FCA 157, cervix long and closed, has ovarian cyst on right 2.7 cm and on left 3.2 cm. Pt wants termination.  Assessment:    Pregnant  Bilateral ovarian cyst    Plan:     Pt plans to seek termination, numbers given [place] 2 [place] clinics, [place]  [place]  Follow up prn

## 2013-12-03 NOTE — Progress Notes (Signed)
U/S-single IUP with +FCA noted, FHR- 157bpm, cx appears closed, Rt ovary with 2.7cm cyst noted and Lt ovary with 3.2cm cyst noted, no free fluid noted within pelvis, CRL c/w 8+1wks EDD 07/14/2014

## 2013-12-03 NOTE — Patient Instructions (Signed)
Numbers given for clinic in [place] and [place] Follow up prn

## 2014-07-23 ENCOUNTER — Encounter: Payer: Self-pay | Admitting: *Deleted

## 2014-07-29 ENCOUNTER — Encounter: Payer: Self-pay | Admitting: *Deleted

## 2015-10-09 IMAGING — CR DG ANKLE COMPLETE 3+V*R*
3 series · 3 of 3 positions shown · non-contrast
Comparison: None.

CLINICAL DATA: Medial right ankle pain and swelling status post
twisting injury 2 days ago.

EXAM:
RIGHT ANKLE - COMPLETE 3+ VIEW

[view not recorded (1 of 3)]
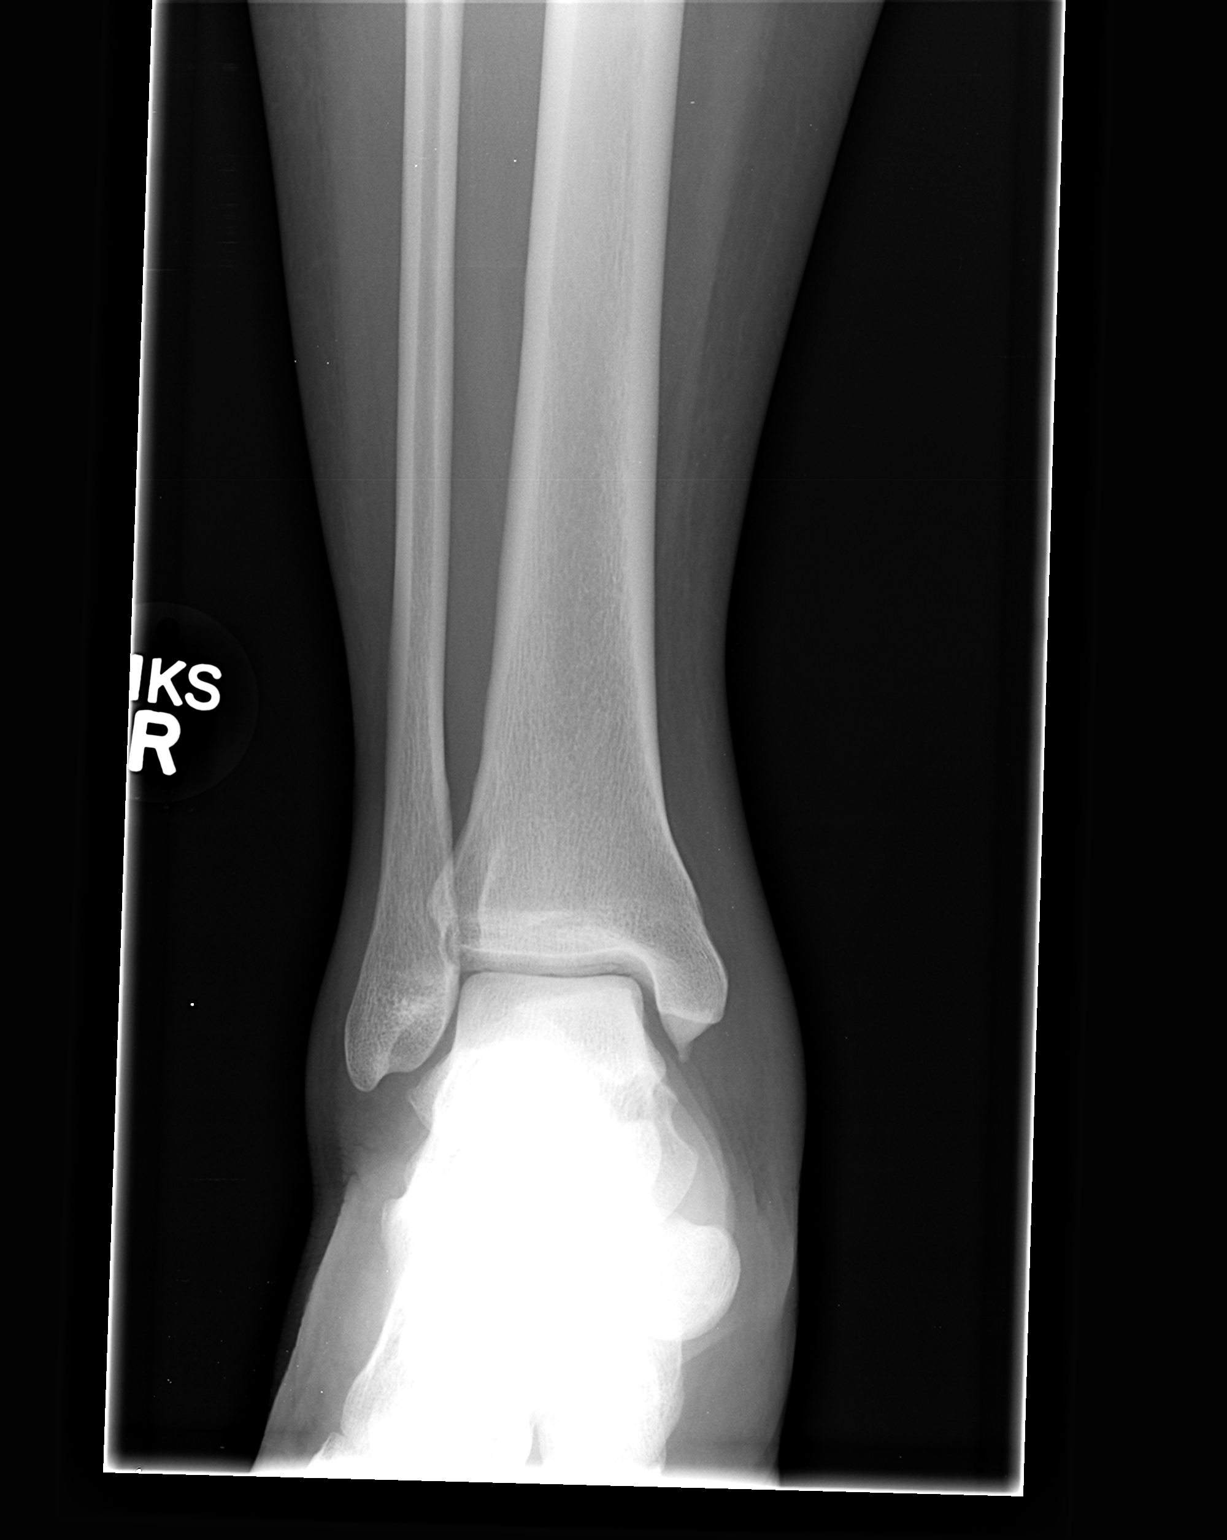

[view not recorded (2 of 3)]
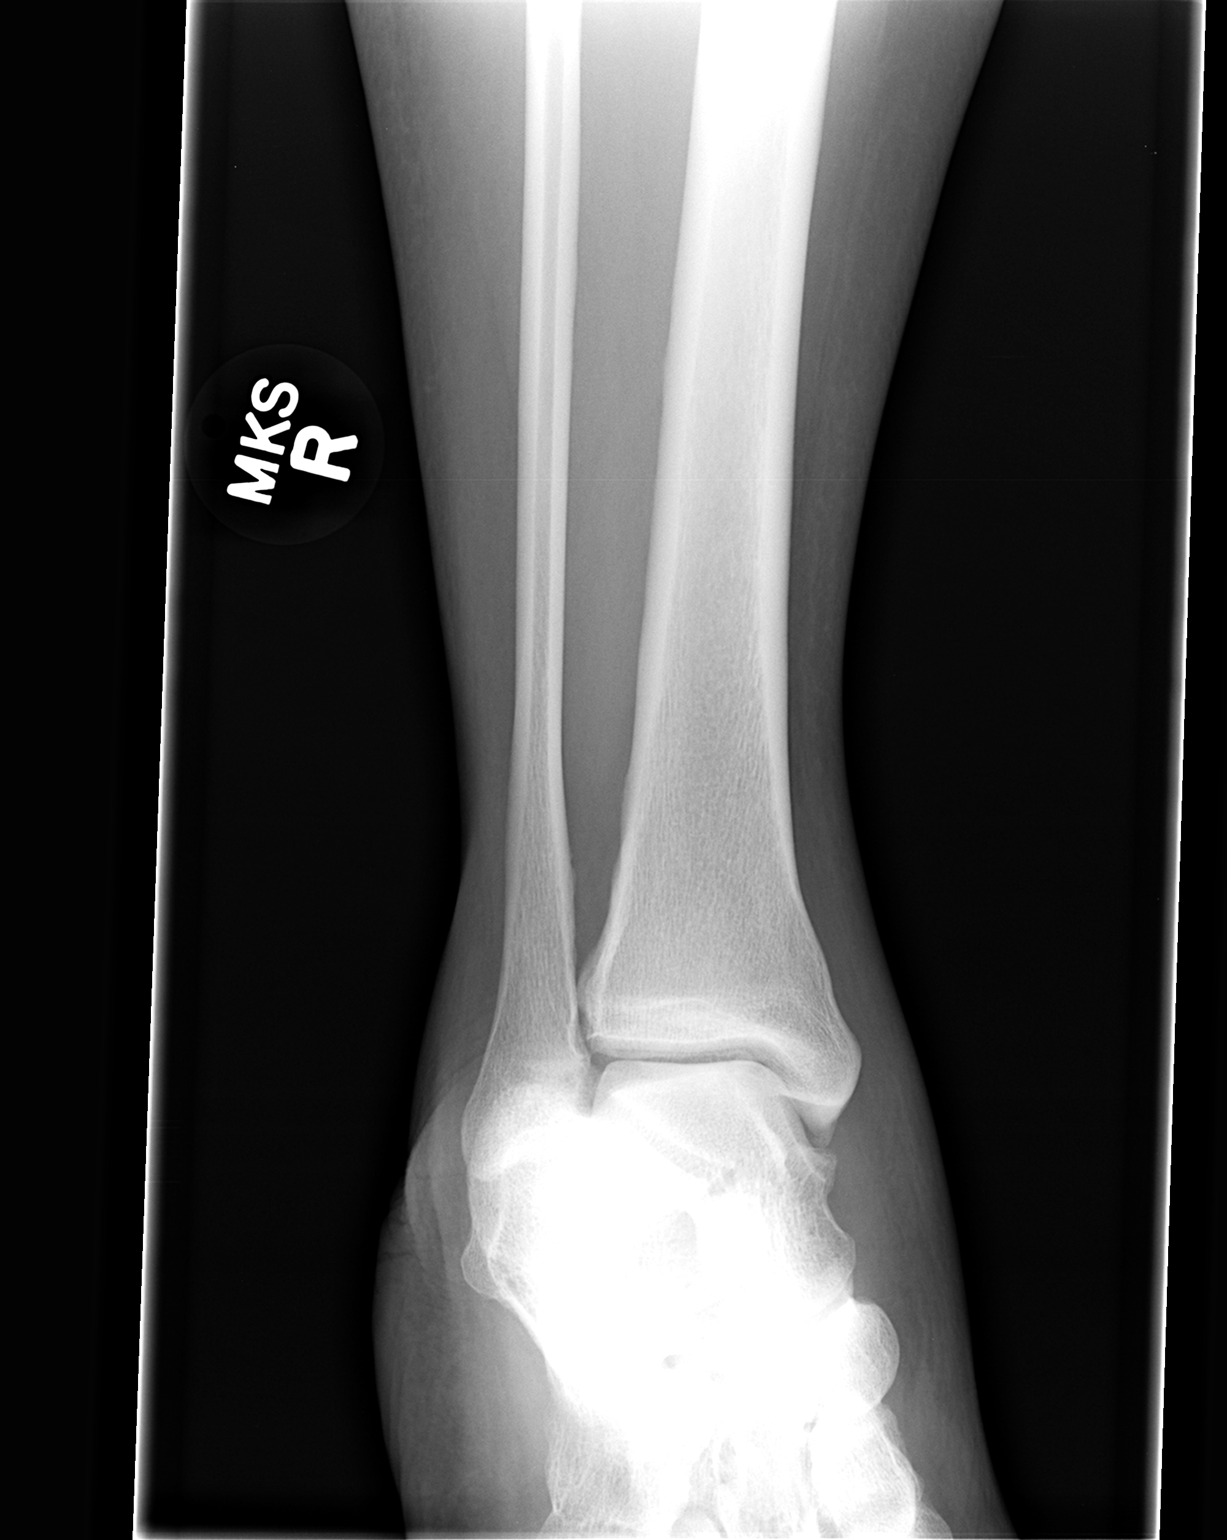

[view not recorded (3 of 3)]
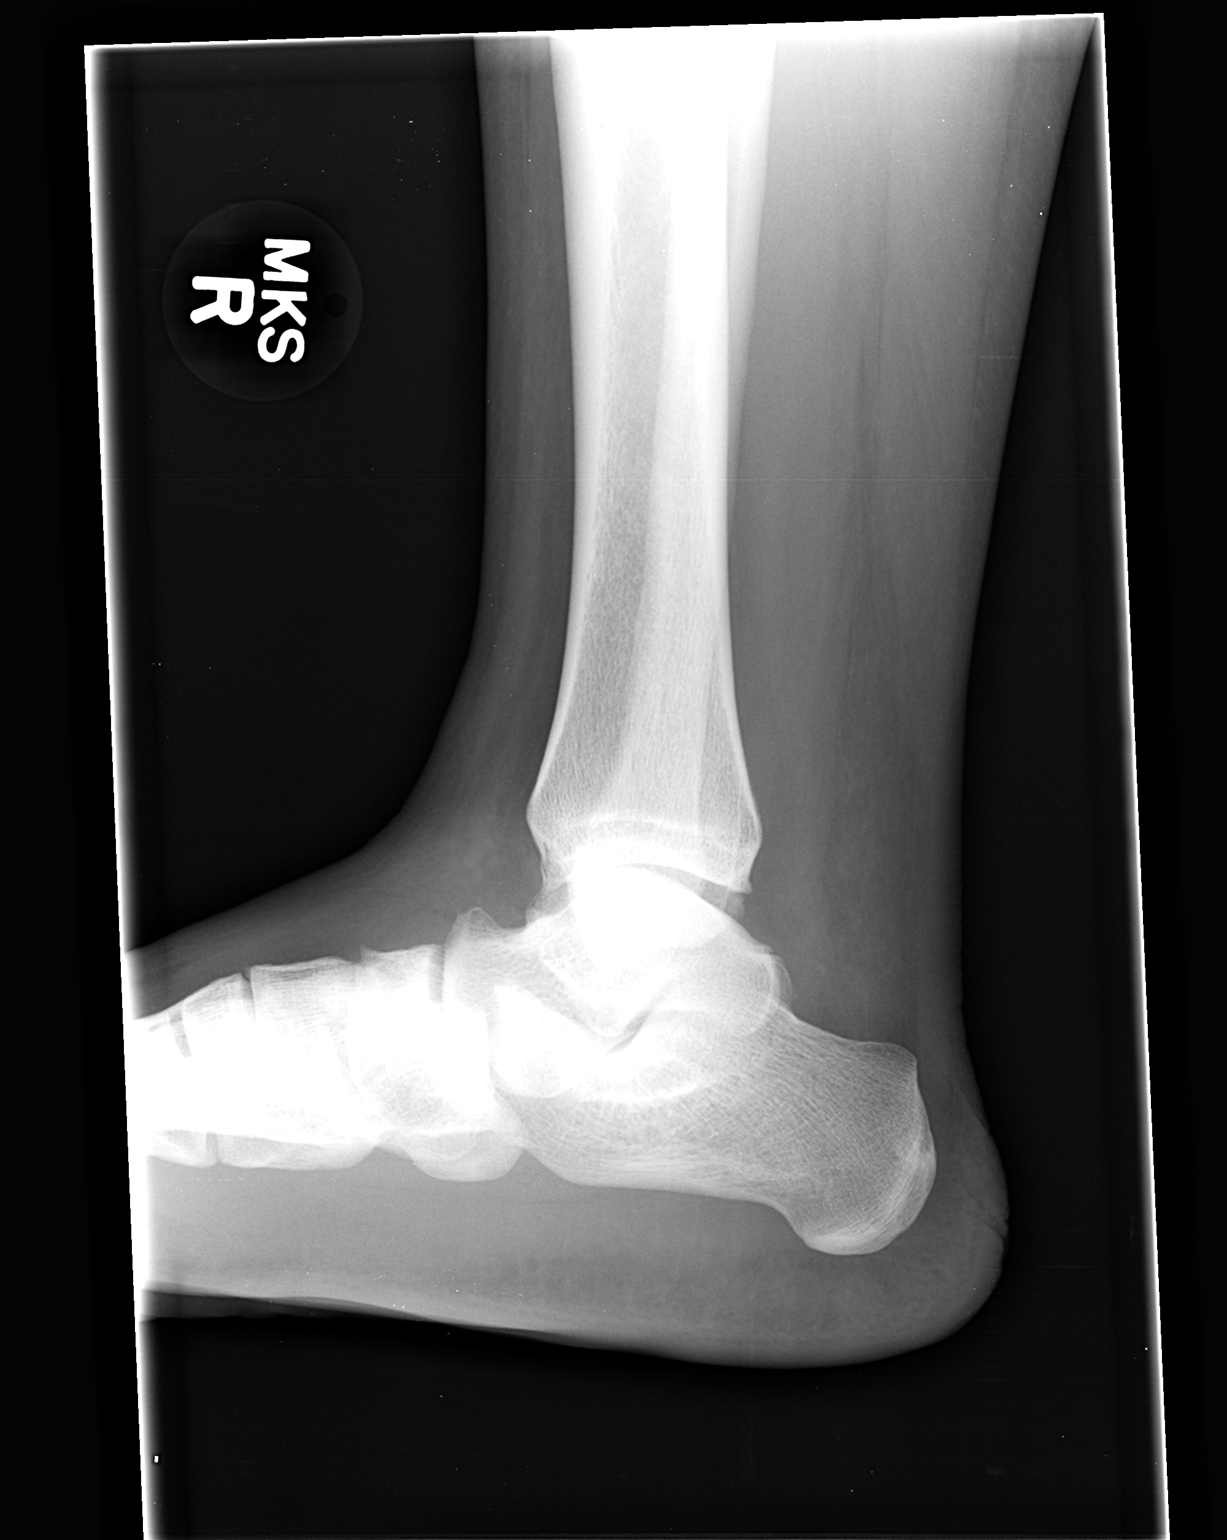

[3 of 3 positions shown; findings below may reference images not displayed]

FINDINGS: The ankle joint mortise is preserved. The talar dome appears normal.
There is no evidence of an acute malleolar fracture. There is a tiny
spur noted from the inferior aspect of the medial malleolus. There
is mild diffuse soft tissue swelling especially medially. The talus
and calcaneus appear intact.
IMPRESSION: No acute fracture of the right ankle is demonstrated. There is mild
diffuse soft tissue swelling.

## 2018-01-18 ENCOUNTER — Encounter: Payer: Self-pay | Admitting: *Deleted

## 2018-02-15 ENCOUNTER — Encounter (INDEPENDENT_AMBULATORY_CARE_PROVIDER_SITE_OTHER): Payer: Self-pay

## 2018-02-15 ENCOUNTER — Encounter: Payer: Self-pay | Admitting: Obstetrics and Gynecology

## 2018-02-15 ENCOUNTER — Other Ambulatory Visit: Payer: Self-pay

## 2018-02-15 ENCOUNTER — Ambulatory Visit: Payer: BC Managed Care – PPO | Admitting: Obstetrics and Gynecology

## 2018-02-15 VITALS — BP 122/84 | HR 104 | Ht 65.5 in | Wt 264.0 lb

## 2018-02-15 DIAGNOSIS — R102 Pelvic and perineal pain: Secondary | ICD-10-CM

## 2018-02-15 DIAGNOSIS — R3915 Urgency of urination: Secondary | ICD-10-CM | POA: Diagnosis not present

## 2018-02-15 DIAGNOSIS — K529 Noninfective gastroenteritis and colitis, unspecified: Secondary | ICD-10-CM | POA: Diagnosis not present

## 2018-02-15 DIAGNOSIS — N898 Other specified noninflammatory disorders of vagina: Secondary | ICD-10-CM

## 2018-02-15 LAB — POCT WET PREP (WET MOUNT): Trichomonas Wet Prep HPF POC: ABSENT

## 2018-02-15 LAB — POCT URINALYSIS DIPSTICK
Blood, UA: NEGATIVE
GLUCOSE UA: NEGATIVE
Ketones, UA: NEGATIVE
LEUKOCYTES UA: NEGATIVE
Nitrite, UA: NEGATIVE
Protein, UA: NEGATIVE

## 2018-02-15 NOTE — Addendum Note (Signed)
Addended by: Tish Frederickson A on: 02/15/2018 02:15 PM   Modules accepted: Orders

## 2018-02-15 NOTE — Progress Notes (Addendum)
[facility] Clinic Visit  @            Patient name: Briana Nichols MRN [id number]  Date of birth: 06-28-1986  CC & HPI:  Briana Nichols is a 32 y.o. female presenting today for pelvic pressure localized near her bladder. Pt reports associated bladder leakage, recurrent diarrhea (following her last pregnancy/delivery), and white-clear vaginal discharge. She was evaluated by her PCP for her mild bladder leakage and she notes that she has to wear panty liners, however, she notes that when she goes when she initially has an urge, she doesn't have any leakage. Pt has not tried any medications for her symptoms. She denies constipation, and any other symptoms. She notes that she works at [employer] and walks for up to 12 hours during her shift. She has two children with her most recent being age 22 who was born 6 weeks early and had a cerclage. She has depo-provera for her contraceptive measures   ROS:  ROS +Bladder leakage +Chronic diarrhea +White/Clear vaginal discharge All systems are negative except as noted in the HPI and PMH.   Pertinent History Reviewed:   Reviewed: Significant for BV Medical         Past Medical History:  Diagnosis Date  . BV (bacterial vaginosis)   . Gestational diabetes    with first pregnancy  . Hx of chlamydia infection   . Other and unspecified ovarian cyst 12/03/2013  . Pregnant 12/03/2013   Does not want to continue will seek termination                              Surgical Hx:    Past Surgical History:  Procedure Laterality Date  . CERVICAL CERCLAGE  06/08/2012   Procedure: CERCLAGE CERVICAL;  Surgeon: Tilda Burrow, MD;  Location: [facility];  Service: Gynecology;  Laterality: N/A;  . CESAREAN SECTION  2011   Medications: Reviewed & Updated - see associated section                       Current Outpatient Medications:  Marland Kitchen  Multiple Vitamins-Minerals (WOMENS MULTI VITAMIN & MINERAL PO), Take 3 tablets by mouth daily., Disp: , Rfl:     Social History: Reviewed -  reports that she has never smoked. She has never used smokeless tobacco.  Objective Findings:  Vitals: unknown if currently breastfeeding.  PHYSICAL EXAMINATION General appearance - alert, well appearing, and in no distress and oriented to person, place, and time Mental status - alert, oriented to person, place, and time, normal mood, behavior, speech, dress, motor activity, and thought processes  PELVIC Pelvic exam: normal external genitalia, vulva, vagina, cervix, uterus and adnexa,  VULVA: normal appearing vulva with no masses, tenderness or lesions, VAGINA: normal appearing vagina with normal color and discharge, no lesions, WET MOUNT done - results: KOH done and negative, multiple clue cells, trich negative,  CERVIX: normal appearing cervix without discharge or lesions,  UTERUS: uterus is normal size, shape, consistency and nontender, ADNEXA: normal adnexa in size, nontender and no masses. BLADDER: Excellent support of bladder.   Assessment & Plan:   A:  1.  Urinary urgency, no anatomic abnormalities noted.  2     physiologic vag d/c 3    Pelvic pressure P:  1. Urinalysis to evaluate urinary symptoms= negative 2. F/u PRN  By signing my name below, I, Soijett Blue, attest that this documentation  has been prepared under the direction and in the presence of Tilda Burrow, MD. Electronically Signed: Soijett Blue, Medical Scribe. 02/15/18. 10:48 AM.  I personally performed the services described in this documentation, which was SCRIBED in my presence. The recorded information has been reviewed and considered accurate. It has been edited as necessary during review. Tilda Burrow, MD      .

## 2018-06-01 ENCOUNTER — Ambulatory Visit: Payer: BC Managed Care – PPO

## 2018-06-01 ENCOUNTER — Other Ambulatory Visit: Payer: BC Managed Care – PPO

## 2018-06-08 ENCOUNTER — Ambulatory Visit (INDEPENDENT_AMBULATORY_CARE_PROVIDER_SITE_OTHER): Payer: BC Managed Care – PPO | Admitting: Obstetrics and Gynecology

## 2018-06-08 ENCOUNTER — Encounter: Payer: Self-pay | Admitting: Obstetrics and Gynecology

## 2018-06-08 ENCOUNTER — Other Ambulatory Visit (HOSPITAL_COMMUNITY)
Admission: RE | Admit: 2018-06-08 | Discharge: 2018-06-08 | Disposition: A | Discharge status: Home / Self Care | Payer: BC Managed Care – PPO | Source: Ambulatory Visit | Attending: Obstetrics and Gynecology | Admitting: Obstetrics and Gynecology

## 2018-06-08 VITALS — BP 132/79 | HR 90 | Ht 65.0 in | Wt 272.0 lb

## 2018-06-08 DIAGNOSIS — N6489 Other specified disorders of breast: Secondary | ICD-10-CM

## 2018-06-08 DIAGNOSIS — Z01419 Encounter for gynecological examination (general) (routine) without abnormal findings: Secondary | ICD-10-CM | POA: Diagnosis not present

## 2018-06-08 DIAGNOSIS — Z113 Encounter for screening for infections with a predominantly sexual mode of transmission: Secondary | ICD-10-CM

## 2018-06-08 DIAGNOSIS — N643 Galactorrhea not associated with childbirth: Secondary | ICD-10-CM

## 2018-06-08 NOTE — Progress Notes (Signed)
Patient ID: Briana Nichols, female   DOB: [birth year], 32 y.o.   MRN: [id number]  Assessment:  1. Annual Gyn Exam 2. Obesity. Body mass index is 45.26 kg/m.  3. STD screening -- GC/CHL and blood work 4 reduced sex drive 5 mastalgia  With galactorrhea minimal" Check prolactin level today Plan:  1. pap smear done, next pap due 3 years 2. return 3 years or prn 3    Annual mammogram advised after age 34 Subjective:  Briana Nichols is a 32 y.o. female [phone] who presents for annual exam. No LMP recorded. Patient has had an injection. The patient reports intermittent breast pain that started recently. She suspects is it due to hormones because she missed a Depo shot. She plans on getting back on Depo for birth control but denies sexual activity for the past few months. Pt requests STD screening and says she has a decreased sex drive. She has been lactating for 8 years -- if she squeezes her right  breast, a few drops of milk come out. Her last pap was 07/26/2011 and was normal. She has temporary guardianship of her 669 y.o. great nephew because his mom is on drugs. She had one of her 2 other children vaginally.  The following portions of the patient's history were reviewed and updated as appropriate: allergies, current medications, past family history, past medical history, past social history, past surgical history and problem list. Past Medical History:  Diagnosis Date  . BV (bacterial vaginosis)   . Gestational diabetes    with first pregnancy  . Hx of chlamydia infection   . Other and unspecified ovarian cyst 12/03/2013  . Pregnant 12/03/2013   Does not want to continue will seek termination  . Vaginal Pap smear, abnormal     Past Surgical History:  Procedure Laterality Date  . CERVICAL CERCLAGE  06/08/2012   Procedure: CERCLAGE CERVICAL;  Surgeon: Tilda BurrowJohn V Millman, MD;  Location: [facility];  Service: Gynecology;  Laterality: N/A;  . CESAREAN SECTION  2011    Current Outpatient Medications:   .  Cholecalciferol (VITAMIN D3) 10000 units TABS, Take by mouth., Disp: , Rfl:  .  Multiple Vitamins-Minerals (WOMENS MULTI VITAMIN & MINERAL PO), Take 3 tablets by mouth daily., Disp: , Rfl:   Review of Systems Constitutional: negative Gastrointestinal: negative Genitourinary: negative  Objective:  BP 132/79 (BP Location: Left Arm, Patient Position: Sitting, Cuff Size: Normal)   Pulse 90   Ht 5\' 5"  (1.651 m)   Wt 272 lb (123.4 kg)   BMI 45.26 kg/m    BMI: Body mass index is 45.26 kg/m.  General Appearance: Alert, appropriate appearance for age. No acute distress HEENT: Grossly normal Neck / Thyroid:  Cardiovascular: RRR; normal S1, S2, no murmur Lungs: CTA bilaterally Back: No CVAT Breast Exam: pendulous. No abnormalities noted. Mastalgia. Gastrointestinal: Soft, non-tender, no masses or organomegaly Pelvic Exam:  Vagina: long. Normal secretions. Uterus: normal size. No tenderness. Rectovaginal: not indicated Lymphatic Exam: Non-palpable nodes in neck, clavicular, axillary, or inguinal regions  Skin: no rash or abnormalities Neurologic: Normal gait and speech, no tremor  Psychiatric: Alert and oriented, appropriate affect.  Urinalysis:Not done  By signing my name below, I, Pietro CassisEmily Tufford, attest that this documentation has been prepared under the direction and in the presence of Tilda BurrowFerguson, Aracelli Woloszyn V, MD. Electronically Signed: Pietro CassisEmily Tufford, Medical Scribe. 06/08/18. 9:31 AM.  I personally performed the services described in this documentation, which was SCRIBED in my presence. The recorded information has been reviewed and considered  accurate. It has been edited as necessary during review. Jonnie Kind, MD

## 2018-06-09 LAB — RPR: RPR Ser Ql: NONREACTIVE

## 2018-06-09 LAB — PROLACTIN: Prolactin: 16.4 ng/mL (ref 4.8–23.3)

## 2018-06-09 LAB — HIV ANTIBODY (ROUTINE TESTING W REFLEX): HIV SCREEN 4TH GENERATION: NONREACTIVE

## 2018-06-12 ENCOUNTER — Other Ambulatory Visit: Payer: Self-pay | Admitting: *Deleted

## 2018-06-12 ENCOUNTER — Other Ambulatory Visit: Payer: Self-pay

## 2018-06-12 ENCOUNTER — Other Ambulatory Visit: Payer: Self-pay | Admitting: Obstetrics and Gynecology

## 2018-06-12 ENCOUNTER — Ambulatory Visit (INDEPENDENT_AMBULATORY_CARE_PROVIDER_SITE_OTHER): Payer: BC Managed Care – PPO | Admitting: *Deleted

## 2018-06-12 ENCOUNTER — Other Ambulatory Visit: Payer: BC Managed Care – PPO

## 2018-06-12 DIAGNOSIS — Z3202 Encounter for pregnancy test, result negative: Secondary | ICD-10-CM | POA: Diagnosis not present

## 2018-06-12 DIAGNOSIS — Z3042 Encounter for surveillance of injectable contraceptive: Secondary | ICD-10-CM

## 2018-06-12 LAB — CYTOLOGY - PAP
CHLAMYDIA, DNA PROBE: NEGATIVE
DIAGNOSIS: NEGATIVE
HPV: NOT DETECTED
Neisseria Gonorrhea: NEGATIVE

## 2018-06-12 LAB — POCT URINE PREGNANCY: Preg Test, Ur: NEGATIVE

## 2018-06-12 MED ORDER — MEDROXYPROGESTERONE ACETATE 150 MG/ML IM SUSP
150.0000 mg | Freq: Once | INTRAMUSCULAR | Status: AC
  Administered 2018-06-12: 150 mg via INTRAMUSCULAR

## 2018-06-12 MED ORDER — METRONIDAZOLE 500 MG PO TABS: 500.0000 mg | ORAL_TABLET | Freq: Two times a day (BID) | ORAL | 0 refills | Status: DC

## 2018-06-12 MED ORDER — MEDROXYPROGESTERONE ACETATE 150 MG/ML IM SUSP: 150.0000 mg | INTRAMUSCULAR | 3 refills | Status: DC

## 2018-06-12 NOTE — Progress Notes (Signed)
Pt given DepoProvera 150mg IM left deltoid without complications. Advised to return in 12 weeks for next injection. 

## 2018-06-12 NOTE — Progress Notes (Signed)
Rx for metronidazole bid x 7 d patient and partner called to pharmacy.

## 2018-06-13 ENCOUNTER — Telehealth: Payer: Self-pay | Admitting: *Deleted

## 2018-06-13 LAB — BETA HCG QUANT (REF LAB): hCG Quant: <1 m[IU]/mL

## 2018-06-13 NOTE — Telephone Encounter (Signed)
LMOVM for patient to return my call

## 2018-06-14 ENCOUNTER — Telehealth: Payer: Self-pay | Admitting: *Deleted

## 2018-06-14 NOTE — Telephone Encounter (Signed)
LMOVM for patient to return my call.  Pt has trich. Medication sent to pharmacy.

## 2018-06-15 ENCOUNTER — Telehealth: Payer: Self-pay | Admitting: *Deleted

## 2018-06-15 NOTE — Telephone Encounter (Signed)
Patient notified of +trich on Pap. States she saw [facility] [facility] message and has already started the medication. Informed to give 14 tablets to partner for him to take as well. Pt verbalized understanding and POC appt set up.

## 2018-06-16 ENCOUNTER — Telehealth: Payer: Self-pay | Admitting: Obstetrics and Gynecology

## 2018-06-16 MED ORDER — PROMETHAZINE HCL 25 MG PO TABS: 25.0000 mg | ORAL_TABLET | Freq: Four times a day (QID) | ORAL | 1 refills | Status: DC | PRN

## 2018-06-16 NOTE — Telephone Encounter (Signed)
Left message that I sent rx phenergan in to take before taking the flagyl to help with nausea.

## 2018-06-16 NOTE — Telephone Encounter (Signed)
LMOVM returning patient's call.  

## 2018-06-16 NOTE — Telephone Encounter (Signed)
Pt was given some meds on Tues and she is not able to take them. Makes her sick needs to talk to a nurse

## 2018-06-16 NOTE — Telephone Encounter (Signed)
Patient states she is having a hard time keeping the medication down she is taking for treatment for trich.  She has tried eating when taking the medication and other tricks but nothing is working. Is there a liquid form of this medication she could take? Please advise.

## 2018-07-03 ENCOUNTER — Ambulatory Visit: Payer: BC Managed Care – PPO | Admitting: Adult Health

## 2018-09-04 ENCOUNTER — Ambulatory Visit (INDEPENDENT_AMBULATORY_CARE_PROVIDER_SITE_OTHER): Payer: BC Managed Care – PPO

## 2018-09-04 DIAGNOSIS — Z3202 Encounter for pregnancy test, result negative: Secondary | ICD-10-CM

## 2018-09-04 DIAGNOSIS — Z3042 Encounter for surveillance of injectable contraceptive: Secondary | ICD-10-CM

## 2018-09-04 LAB — POCT URINE PREGNANCY: Preg Test, Ur: NEGATIVE

## 2018-09-04 MED ORDER — MEDROXYPROGESTERONE ACETATE 150 MG/ML IM SUSP
150.0000 mg | Freq: Once | INTRAMUSCULAR | Status: AC
  Administered 2018-09-04: 150 mg via INTRAMUSCULAR

## 2018-09-04 NOTE — Progress Notes (Signed)
Pt here for depo injection 150 mg IM given rt deltoid. Tolerated well . Return 12 weeks for next injection.pad CMA 

## 2018-11-27 ENCOUNTER — Ambulatory Visit (INDEPENDENT_AMBULATORY_CARE_PROVIDER_SITE_OTHER): Payer: BC Managed Care – PPO | Admitting: *Deleted

## 2018-11-27 VITALS — Ht 65.2 in | Wt 268.0 lb

## 2018-11-27 DIAGNOSIS — Z3202 Encounter for pregnancy test, result negative: Secondary | ICD-10-CM | POA: Diagnosis not present

## 2018-11-27 DIAGNOSIS — Z3042 Encounter for surveillance of injectable contraceptive: Secondary | ICD-10-CM

## 2018-11-27 LAB — POCT URINE PREGNANCY: Preg Test, Ur: NEGATIVE

## 2018-11-27 MED ORDER — MEDROXYPROGESTERONE ACETATE 150 MG/ML IM SUSP
150.0000 mg | Freq: Once | INTRAMUSCULAR | Status: AC
  Administered 2018-11-27: 150 mg via INTRAMUSCULAR

## 2018-11-27 NOTE — Progress Notes (Signed)
Pt here for depo injection 150 mg IM given lt deltoid. Tolerated well.Return 12 weeks for next injection. Pad CMA 

## 2019-02-20 ENCOUNTER — Ambulatory Visit: Payer: Medicaid Other

## 2019-02-20 ENCOUNTER — Ambulatory Visit (INDEPENDENT_AMBULATORY_CARE_PROVIDER_SITE_OTHER): Payer: BC Managed Care – PPO | Admitting: *Deleted

## 2019-02-20 ENCOUNTER — Other Ambulatory Visit: Payer: Self-pay

## 2019-02-20 DIAGNOSIS — Z3042 Encounter for surveillance of injectable contraceptive: Secondary | ICD-10-CM | POA: Diagnosis not present

## 2019-02-20 MED ORDER — MEDROXYPROGESTERONE ACETATE 150 MG/ML IM SUSP
150.0000 mg | Freq: Once | INTRAMUSCULAR | Status: AC
  Administered 2019-02-20: 10:00:00 150 mg via INTRAMUSCULAR

## 2019-02-20 NOTE — Progress Notes (Signed)
Depo Provera 150mg IM given in left deltoid with no complications. Patient to return in 12 weeks for next injection.  

## 2019-05-15 ENCOUNTER — Ambulatory Visit: Payer: BC Managed Care – PPO

## 2019-05-15 ENCOUNTER — Other Ambulatory Visit: Payer: Self-pay

## 2019-05-15 ENCOUNTER — Ambulatory Visit (INDEPENDENT_AMBULATORY_CARE_PROVIDER_SITE_OTHER): Payer: BC Managed Care – PPO

## 2019-05-15 VITALS — Ht 65.2 in | Wt 277.0 lb

## 2019-05-15 DIAGNOSIS — Z3202 Encounter for pregnancy test, result negative: Secondary | ICD-10-CM

## 2019-05-15 DIAGNOSIS — Z3042 Encounter for surveillance of injectable contraceptive: Secondary | ICD-10-CM | POA: Diagnosis not present

## 2019-05-15 LAB — POCT URINE PREGNANCY: Preg Test, Ur: NEGATIVE

## 2019-05-15 MED ORDER — MEDROXYPROGESTERONE ACETATE 150 MG/ML IM SUSP
150.0000 mg | Freq: Once | INTRAMUSCULAR | Status: AC
  Administered 2019-05-15: 150 mg via INTRAMUSCULAR

## 2019-05-15 NOTE — Progress Notes (Signed)
Pt here for depo injection 150 mg IM given rt deltoid. Tolerated well. Return 12 weeks for next injection. Pad CMA 

## 2019-08-07 ENCOUNTER — Other Ambulatory Visit: Payer: Self-pay

## 2019-08-07 ENCOUNTER — Ambulatory Visit (INDEPENDENT_AMBULATORY_CARE_PROVIDER_SITE_OTHER): Payer: BC Managed Care – PPO | Admitting: *Deleted

## 2019-08-07 DIAGNOSIS — Z3042 Encounter for surveillance of injectable contraceptive: Secondary | ICD-10-CM

## 2019-08-07 MED ORDER — MEDROXYPROGESTERONE ACETATE 150 MG/ML IM SUSP
150.0000 mg | Freq: Once | INTRAMUSCULAR | Status: AC
  Administered 2019-08-07: 16:00:00 150 mg via INTRAMUSCULAR

## 2019-08-07 NOTE — Progress Notes (Signed)
   NURSE VISIT- INJECTION  SUBJECTIVE:  Briana Nichols is a 33 y.o. [phone] female here for a Depo Provera for contraception/period management. She is a GYN patient.   OBJECTIVE:  There were no vitals taken for this visit.  Appears well, in no apparent distress  Injection administered in: Left deltoid  Meds ordered this encounter  Medications  . medroxyPROGESTERone (DEPO-PROVERA) injection 150 mg    ASSESSMENT: GYN patient Depo Provera for contraception/period management  PLAN: Follow-up: in 11-13 weeks for next Depo   Alice Rieger  08/07/2019 4:18 PM

## 2019-10-25 ENCOUNTER — Ambulatory Visit (INDEPENDENT_AMBULATORY_CARE_PROVIDER_SITE_OTHER): Payer: BC Managed Care – PPO | Admitting: Cardiovascular Disease

## 2019-10-25 ENCOUNTER — Encounter: Payer: Self-pay | Admitting: Cardiovascular Disease

## 2019-10-25 VITALS — BP 136/81 | HR 99 | Temp 99.6°F | Ht 65.5 in | Wt 266.0 lb

## 2019-10-25 DIAGNOSIS — R000 Tachycardia, unspecified: Secondary | ICD-10-CM

## 2019-10-25 DIAGNOSIS — R002 Palpitations: Secondary | ICD-10-CM | POA: Diagnosis not present

## 2019-10-25 DIAGNOSIS — I499 Cardiac arrhythmia, unspecified: Secondary | ICD-10-CM | POA: Diagnosis not present

## 2019-10-25 NOTE — Patient Instructions (Addendum)
Medication Instructions:  Your physician recommends that you continue on your current medications as directed. Please refer to the Current Medication list given to you today.  *If you need a refill on your cardiac medications before your next appointment, please call your pharmacy*  Lab Work: None today, I will request your lab work from your PCP. If you have labs (blood work) drawn today and your tests are completely normal, you will receive your results only by: Marland Kitchen MyChart Message (if you have MyChart) OR . A paper copy in the mail If you have any lab test that is abnormal or we need to change your treatment, we will call you to review the results.  Testing/Procedures: Your physician has recommended that you wear an event monitor for 2 weeks. Event monitors are medical devices that record the heart's electrical activity. Doctors most often [place] these monitors to diagnose arrhythmias. Arrhythmias are problems with the speed or rhythm of the heartbeat. The monitor is a small, portable device. You can wear one while you do your normal daily activities. This is usually used to diagnose what is causing palpitations/syncope (passing out).  Your physician has requested that you have an echocardiogram. Echocardiography is a painless test that uses sound waves to create images of your heart. It provides your doctor with information about the size and shape of your heart and how well your heart's chambers and valves are working. This procedure takes approximately one hour. There are no restrictions for this procedure.    Follow-Up: At [facility], you and your health needs are our priority.  As part of our continuing mission to provide you with exceptional heart care, we have created designated Provider Care Teams.  These Care Teams include your primary Cardiologist (physician) and Advanced Practice Providers (APPs -  Physician Assistants and Nurse Practitioners) who all work together to provide you with  the care you need, when you need it.  Your next appointment:   3 month(s)  The format for your next appointment:   Virtual Visit   Provider:   Prentice Docker, MD  Other Instructions None     Thank you for choosing [facility] !

## 2019-10-25 NOTE — Progress Notes (Addendum)
CARDIOLOGY CONSULT NOTE  Patient ID: Malayjah Otoole MRN: [id number] DOB/AGE: 1986-05-09 34 y.o.  Admit date: (Not on file) Primary Physician: Joyice Faster, FNP  Reason for Consultation: Arrhythmia  HPI: Briana Nichols is a 34 y.o. female who is being seen today for the evaluation of arrhythmia at the request of Joyice Faster, FNP.   ECG performed in the office today which I ordered and personally interpreted demonstrates normal sinus rhythm with no ischemic ST segment or T-wave abnormalities, nor any arrhythmias.  She has been experiencing tachycardia and palpitations for the past 2 months.  She also complains of a dull chest ache in the upper central part of her chest.  She bought a pulse oximeter and has noticed heart rates fluctuating from the 90 bpm range up to the 150 bpm range.  She denies leg swelling, orthopnea, and syncope.  She works as a Chiropodist at [employer].  She apparently had blood work at her PCPs last month but I do not have a copy of this.  She was diagnosed with COVID-19 in December.  No Known Allergies  Current Outpatient Medications  Medication Sig Dispense Refill  . aspirin EC 81 MG tablet Take 81 mg by mouth daily.    . Cholecalciferol (VITAMIN D3) 10000 units TABS Take by mouth.    . medroxyPROGESTERone (DEPO-PROVERA) 150 MG/ML injection Inject 1 mL (150 mg total) into the muscle every 3 (three) months. 1 mL 3  . Multiple Vitamins-Minerals (WOMENS MULTI VITAMIN & MINERAL PO) Take 3 tablets by mouth daily.     No current facility-administered medications for this visit.    Past Medical History:  Diagnosis Date  . BV (bacterial vaginosis)   . Gestational diabetes    with first pregnancy  . Hx of chlamydia infection   . Other and unspecified ovarian cyst 12/03/2013  . Pregnant 12/03/2013   Does not want to continue will seek termination  . Vaginal Pap smear, abnormal     Past Surgical History:   Procedure Laterality Date  . CERVICAL CERCLAGE  06/08/2012   Procedure: CERCLAGE CERVICAL;  Surgeon: Jonnie Kind, MD;  Location: [facility];  Service: Gynecology;  Laterality: N/A;  . CESAREAN SECTION  2011    Social History   Socioeconomic History  . Marital status: Single    Spouse name: Not on file  . Number of children: 2  . Years of education: Not on file  . Highest education level: Not on file  Occupational History  . Not on file  Tobacco Use  . Smoking status: Never Smoker  . Smokeless tobacco: Never Used  Substance and Sexual Activity  . Alcohol use: Yes    Comment: occ  . Drug use: No  . Sexual activity: Not Currently    Partners: Male    Birth control/protection: Injection  Other Topics Concern  . Not on file  Social History Narrative  . Not on file   Social Determinants of Health   Financial Resource Strain:   . Difficulty of Paying Living Expenses: Not on file  Food Insecurity:   . Worried About Charity fundraiser in the Last Year: Not on file  . Ran Out of Food in the Last Year: Not on file  Transportation Needs:   . Lack of Transportation (Medical): Not on file  . Lack of Transportation (Non-Medical): Not on file  Physical Activity:   . Days of Exercise per Week: Not  on file  . Minutes of Exercise per Session: Not on file  Stress:   . Feeling of Stress : Not on file  Social Connections:   . Frequency of Communication with Friends and Family: Not on file  . Frequency of Social Gatherings with Friends and Family: Not on file  . Attends Religious Services: Not on file  . Active Member of Clubs or Organizations: Not on file  . Attends Banker Meetings: Not on file  . Marital Status: Not on file  Intimate Partner Violence:   . Fear of Current or Ex-Partner: Not on file  . Emotionally Abused: Not on file  . Physically Abused: Not on file  . Sexually Abused: Not on file     No family history of premature CAD in 1st degree  relatives.  Current Meds  Medication Sig  . aspirin EC 81 MG tablet Take 81 mg by mouth daily.  . Cholecalciferol (VITAMIN D3) 10000 units TABS Take by mouth.  . medroxyPROGESTERone (DEPO-PROVERA) 150 MG/ML injection Inject 1 mL (150 mg total) into the muscle every 3 (three) months.  . Multiple Vitamins-Minerals (WOMENS MULTI VITAMIN & MINERAL PO) Take 3 tablets by mouth daily.      Review of systems complete and found to be negative unless listed above in HPI    Physical exam Blood pressure 136/81, pulse 99, temperature 99.6 F (37.6 C), temperature source Temporal, height 5' 5.5" (1.664 m), weight 266 lb (120.7 kg), SpO2 99 %, unknown if currently breastfeeding. General: NAD, obese female Neck: No JVD, no thyromegaly or thyroid nodule.  Lungs: Clear to auscultation bilaterally with normal respiratory effort. CV: Nondisplaced PMI. Regular rate and rhythm, normal S1/S2, no S3/S4, no murmur.  No peripheral edema.  No carotid bruit.    Abdomen: Soft, nontender, obese.  Skin: Intact without lesions or rashes.  Neurologic: Alert and oriented x 3.  Psych: Normal affect. Extremities: No clubbing or cyanosis.  HEENT: Normal.   ECG: Most recent ECG reviewed.   Labs: Lab Results  Component Value Date/Time   K 3.8 11/21/2013 01:02 AM   BUN 12 11/21/2013 01:02 AM   CREATININE 0.82 11/21/2013 01:02 AM   HGB 13.4 11/21/2013 01:02 AM     Lipids: No results found for: LDLCALC, LDLDIRECT, CHOL, TRIG, HDL      ASSESSMENT AND PLAN:  1.  Tachycardia and palpitations: I will try to obtain blood work from her PCP.  I plan to check TSH, magnesium, and potassium if this has not already been checked recently.  Of note, she was diagnosed with COVID-19 in December 2020.  I will obtain a 2-week event monitor. I will order a 2-D echocardiogram with Doppler to evaluate cardiac structure, function, and regional wall motion.      Disposition: Follow up in 3 months  Signed: Prentice Docker,  M.D., F.A.C.C.  10/25/2019, 9:13 AM

## 2019-10-30 ENCOUNTER — Ambulatory Visit (INDEPENDENT_AMBULATORY_CARE_PROVIDER_SITE_OTHER): Payer: BC Managed Care – PPO

## 2019-10-30 ENCOUNTER — Ambulatory Visit: Payer: BC Managed Care – PPO

## 2019-10-30 DIAGNOSIS — R002 Palpitations: Secondary | ICD-10-CM | POA: Diagnosis not present

## 2019-11-01 ENCOUNTER — Other Ambulatory Visit: Payer: Self-pay

## 2019-11-01 ENCOUNTER — Ambulatory Visit (HOSPITAL_COMMUNITY)
Admission: RE | Admit: 2019-11-01 | Discharge: 2019-11-01 | Disposition: A | Discharge status: Home / Self Care | Payer: BC Managed Care – PPO | Source: Ambulatory Visit | Attending: Cardiovascular Disease | Admitting: Cardiovascular Disease

## 2019-11-01 DIAGNOSIS — R002 Palpitations: Secondary | ICD-10-CM | POA: Diagnosis not present

## 2019-11-01 NOTE — Progress Notes (Signed)
*  PRELIMINARY RESULTS* Echocardiogram 2D Echocardiogram has been performed.  Briana Nichols 11/01/2019, 1:55 PM

## 2019-11-26 ENCOUNTER — Telehealth: Payer: Self-pay

## 2019-11-26 ENCOUNTER — Telehealth: Payer: Self-pay | Admitting: Cardiovascular Disease

## 2019-11-26 NOTE — Telephone Encounter (Signed)
Left message wanting monitor results

## 2019-11-26 NOTE — Telephone Encounter (Signed)
Pt made aware. She voiced understanding.  

## 2019-11-26 NOTE — Telephone Encounter (Signed)
-----   Message from Laqueta Linden, MD sent at 11/23/2019  4:20 PM EST ----- Primarily sinus rhythm and sinus tachycardia.  There were no worrisome arrhythmias.  Her symptoms correlated with both sinus rhythm and sinus tachycardia.

## 2020-01-23 ENCOUNTER — Telehealth: Payer: BC Managed Care – PPO | Admitting: Cardiovascular Disease

## 2020-02-01 ENCOUNTER — Telehealth: Payer: BC Managed Care – PPO | Admitting: Student

## 2020-02-01 ENCOUNTER — Telehealth: Payer: BC Managed Care – PPO | Admitting: Cardiovascular Disease

## 2020-02-22 ENCOUNTER — Telehealth (INDEPENDENT_AMBULATORY_CARE_PROVIDER_SITE_OTHER): Payer: BC Managed Care – PPO | Admitting: Student

## 2020-02-22 ENCOUNTER — Encounter: Payer: Self-pay | Admitting: Student

## 2020-02-22 VITALS — Ht 66.0 in | Wt 258.0 lb

## 2020-02-22 DIAGNOSIS — R000 Tachycardia, unspecified: Secondary | ICD-10-CM | POA: Diagnosis not present

## 2020-02-22 DIAGNOSIS — R002 Palpitations: Secondary | ICD-10-CM | POA: Diagnosis not present

## 2020-02-22 NOTE — Patient Instructions (Signed)
Medication Instructions:  Your physician recommends that you continue on your current medications as directed. Please refer to the Current Medication list given to you today.  *If you need a refill on your cardiac medications before your next appointment, please call your pharmacy*   Lab Work: Your physician recommends that you return for lab work in: [facility])   If you have labs (blood work) drawn today and your tests are completely normal, you will receive your results only by: Marland Kitchen MyChart Message (if you have MyChart) OR . A paper copy in the mail If you have any lab test that is abnormal or we need to change your treatment, we will call you to review the results.   Testing/Procedures: NONE   Follow-Up: At [facility], you and your health needs are our priority.  As part of our continuing mission to provide you with exceptional heart care, we have created designated Provider Care Teams.  These Care Teams include your primary Cardiologist (physician) and Advanced Practice Providers (APPs -  Physician Assistants and Nurse Practitioners) who all work together to provide you with the care you need, when you need it.  We recommend signing up for the patient portal called "MyChart".  Sign up information is provided on this After Visit Summary.  MyChart is used to connect with patients for Virtual Visits (Telemedicine).  Patients are able to view lab/test results, encounter notes, upcoming appointments, etc.  Non-urgent messages can be sent to your provider as well.   To learn more about what you can do with MyChart, go to [email or web address].    Your next appointment:    As Needed   The format for your next appointment:   Either In Person or Virtual  Provider:   Prentice Docker, MD   Other Instructions Thank you for choosing [facility]!

## 2020-02-22 NOTE — Progress Notes (Signed)
Virtual Visit via Telephone Note   This visit type was conducted due to national recommendations for restrictions regarding the COVID-19 Pandemic (e.g. social distancing) in an effort to limit this patient's exposure and mitigate transmission in our community.  Due to her co-morbid illnesses, this patient is at least at moderate risk for complications without adequate follow up.  This format is felt to be most appropriate for this patient at this time.  The patient did not have access to video technology/had technical difficulties with video requiring transitioning to audio format only (telephone).  All issues noted in this document were discussed and addressed.  No physical exam could be performed with this format.  Please refer to the patient's chart for her  consent to telehealth for [facility].   The patient was identified using 2 identifiers.  Date:  02/22/2020   ID:  Nabila Albarracin, DOB December 15, 1985, MRN [id number]  Patient Location: Home Provider Location: Office  PCP:  Altamease Oiler, FNP  Cardiologist:  Prentice Docker, MD  Electrophysiologist:  None   Evaluation Performed:  Follow-Up Visit  Chief Complaint: Recent event monitor and palpitations  History of Present Illness:    Briana Nichols is a 34 y.o. female with with past medical history of palpitations and gestational diabetes who presents to the office today for follow-up from her recent testing.  She was examined by Dr. Purvis Sheffield on 10/25/2019 as a new patient referral for palpitations and she reported intermittent tachycardia and palpitations for the past 2 months. A copy of recent labs from her PCP was requested along with obtaining an echocardiogram and 2-week cardiac monitor. Her echocardiogram showed a preserved EF of 60 to 65% with no regional wall motion abnormalities and no significant valve abnormalities. Event monitor showed normal sinus rhythm and episodes of sinus tachycardia with an average heart rate  of 107 bpm.   In talking with the patient today, she reports having palpitations frequently but she is unsure if they occur on a daily basis as she reports "I just ignore them at times". Symptoms typically last from a few seconds to a few minutes but she denies any persistent symptoms lasting for longer than this. She does report some mild dyspnea when this occurs. She was started on Zoloft by her PCP for anxiety and reports this helped with her symptoms slightly but she still has palpitations.  She has reduced her caffeine intake. Does not consume a significant amount of alcohol and reports only occasional wine.   Past Medical History:  Diagnosis Date  . BV (bacterial vaginosis)   . Gestational diabetes    with first pregnancy  . Heart palpitations   . Hx of chlamydia infection   . Other and unspecified ovarian cyst 12/03/2013  . Vaginal Pap smear, abnormal    Past Surgical History:  Procedure Laterality Date  . CERVICAL CERCLAGE  06/08/2012   Procedure: CERCLAGE CERVICAL;  Surgeon: Tilda Burrow, MD;  Location: [facility];  Service: Gynecology;  Laterality: N/A;  . CESAREAN SECTION  2011     Current Meds  Medication Sig  . medroxyPROGESTERone (DEPO-PROVERA) 150 MG/ML injection Inject 1 mL (150 mg total) into the muscle every 3 (three) months.  . Multiple Vitamins-Minerals (WOMENS MULTI VITAMIN & MINERAL PO) Take 3 tablets by mouth daily.  . sertraline (ZOLOFT) 50 MG tablet Take 50 mg by mouth daily.     Allergies:   Patient has no known allergies.   Social History   Tobacco Use  .  Smoking status: Never Smoker  . Smokeless tobacco: Never Used  Substance Use Topics  . Alcohol use: Yes    Comment: occ  . Drug use: No     Family Hx: The patient's family history includes Breast cancer in her maternal aunt; Cancer in her paternal grandfather; Diabetes in her father, maternal grandmother, and paternal grandmother; Heart disease in her maternal grandmother; Hypertension in her  father, maternal grandfather, maternal grandmother, and mother; Kidney disease in her maternal grandmother.  ROS:   Please see the history of present illness.     All other systems reviewed and are negative.   Prior CV studies:   The following studies were reviewed today:  Echocardiogram: 10/2019 IMPRESSIONS    1. Left ventricular ejection fraction, by visual estimation, is 60 to  65%. The left ventricle has normal function. There is no left ventricular  hypertrophy.  2. The left ventricle has no regional wall motion abnormalities.  3. Global right ventricle has normal systolic function.The right  ventricular size is normal. No increase in right ventricular wall  thickness.  4. Left atrial size was normal.  5. Right atrial size was normal.  6. The mitral valve is normal in structure. Trivial mitral valve  regurgitation.  7. The tricuspid valve is normal in structure.  8. The tricuspid valve is normal in structure. Tricuspid valve  regurgitation is trivial.  9. The aortic valve is tricuspid. Aortic valve regurgitation is not  visualized. No evidence of aortic valve sclerosis or stenosis.  10. The pulmonic valve was grossly normal. Pulmonic valve regurgitation is  not visualized.  11. The inferior vena cava is normal in size with greater than 50%  respiratory variability, suggesting right atrial pressure of 3 mmHg.   Event Monitor: 10/2019  Sinus rhythm, sinus arrhythmia, and sinus tachycardia, average heart rate 107 bpm. Symptoms correlated with both sinus rhythm and sinus tachycardia.  Labs/Other Tests and Data Reviewed:    EKG:  An ECG dated 10/25/2019 was personally reviewed today and demonstrated:  NSR, HR 93 with no acute ST abnormalities.   Recent Labs: No results found for requested labs within last 8760 hours.   Recent Lipid Panel No results found for: CHOL, TRIG, HDL, CHOLHDL, LDLCALC, LDLDIRECT  Wt Readings from Last 3 Encounters:  02/22/20 258 lb  (117 kg)  10/25/19 266 lb (120.7 kg)  05/15/19 277 lb (125.6 kg)     Objective:    Vital Signs:  Ht 5\' 6"  (1.676 m)   Wt 258 lb (117 kg)   BMI 41.64 kg/m    General: Pleasant female sounding in NAD Psych: Normal affect. Neuro: Alert and oriented X 3. Lungs:  Resp regular and unlabored while talking on the phone.   ASSESSMENT & PLAN:    1. Palpitations/Sinus Tachycardia - Recent monitor showed normal sinus rhythm with episodes of sinus tachycardia and echocardiogram showed a preserved EF with no structural abnormalities. Reviewed results with the patient today. - She reports her symptoms have improved since her last visit. We reviewed the possible use of beta-blocker therapy for her sinus tachycardia but she wishes to hold off on medications for now which seems reasonable given her normal echo. If symptoms did worsen, would consider the use of Toprol-XL. - Labs that were previously obtained by her PCP showed normal hemoglobin and electrolytes. It does not appear a TSH was obtained and we will mail her a lab slip for this today. Continued reduction of caffeine intake advised.   COVID-19 Education: The  signs and symptoms of COVID-19 were discussed with the patient and how to seek care for testing (follow up with PCP or arrange E-visit).  The importance of social distancing was discussed today.  Time:   Today, I have spent 11 minutes with the patient with telehealth technology discussing the above problems.     Medication Adjustments/Labs and Tests Ordered: Current medicines are reviewed at length with the patient today.  Concerns regarding medicines are outlined above.   Tests Ordered: Orders Placed This Encounter  Procedures  . TSH    Medication Changes: No orders of the defined types were placed in this encounter.   Follow Up: She wishes to follow-up with Cardiology on a PRN basis.   Signed, Ellsworth Lennox, PA-C  02/22/2020 5:33 PM    [facility]

## 2021-04-21 ENCOUNTER — Other Ambulatory Visit: Payer: BC Managed Care – PPO | Admitting: Obstetrics & Gynecology

## 2021-04-21 ENCOUNTER — Other Ambulatory Visit: Payer: BC Managed Care – PPO | Admitting: Adult Health

## 2021-05-12 ENCOUNTER — Other Ambulatory Visit: Payer: Medicaid Other | Admitting: Obstetrics & Gynecology

## 2021-11-24 ENCOUNTER — Encounter (INDEPENDENT_AMBULATORY_CARE_PROVIDER_SITE_OTHER): Payer: Self-pay | Admitting: *Deleted

## 2022-01-28 ENCOUNTER — Encounter (INDEPENDENT_AMBULATORY_CARE_PROVIDER_SITE_OTHER): Payer: Self-pay | Admitting: Gastroenterology

## 2022-01-28 ENCOUNTER — Ambulatory Visit (INDEPENDENT_AMBULATORY_CARE_PROVIDER_SITE_OTHER): Payer: Medicaid Other | Admitting: Gastroenterology

## 2022-01-29 ENCOUNTER — Encounter (INDEPENDENT_AMBULATORY_CARE_PROVIDER_SITE_OTHER): Payer: Self-pay | Admitting: *Deleted

## 2023-03-04 ENCOUNTER — Encounter: Payer: Self-pay | Admitting: Emergency Medicine

## 2023-03-04 ENCOUNTER — Other Ambulatory Visit: Payer: Self-pay

## 2023-03-04 ENCOUNTER — Ambulatory Visit
Admission: EM | Admit: 2023-03-04 | Discharge: 2023-03-04 | Disposition: A | Discharge status: Home / Self Care | Payer: BC Managed Care – PPO | Attending: Nurse Practitioner | Admitting: Nurse Practitioner

## 2023-03-04 DIAGNOSIS — Z1152 Encounter for screening for COVID-19: Secondary | ICD-10-CM | POA: Diagnosis present

## 2023-03-04 DIAGNOSIS — B349 Viral infection, unspecified: Secondary | ICD-10-CM | POA: Diagnosis present

## 2023-03-04 LAB — POCT INFLUENZA A/B
Influenza A, POC: NEGATIVE
Influenza B, POC: NEGATIVE

## 2023-03-04 LAB — POCT RAPID STREP A (OFFICE): Rapid Strep A Screen: NEGATIVE

## 2023-03-04 NOTE — ED Provider Notes (Signed)
[facility]    CSN: [id number] Arrival date & time: 03/04/23  1731      History   Chief Complaint No chief complaint on file.   HPI Briana Nichols is a 37 y.o. female.   The history is provided by the patient.   The patient presents for complaints of fever, chills, headache, and bodyaches.  Patient states symptoms started over the past 24 hours.  Tmax 101.4.  She denies sore throat, nasal congestion, runny nose, cough, abdominal pain, nausea, vomiting, or diarrhea.  Patient denies any obvious known sick contacts.  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  She reports she has been taking Tylenol for her symptoms.  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 Authorizing Provider  atorvastatin (LIPITOR) 40 MG tablet Take 40 mg by mouth daily.   Yes [provider]  medroxyPROGESTERone (DEPO-PROVERA) 150 MG/ML injection Inject 1 mL (150 mg total) into the muscle every 3 (three) months. 06/12/18   Tilda Burrow, MD  Multiple Vitamins-Minerals (WOMENS MULTI VITAMIN & MINERAL PO) Take  3 tablets by mouth daily.    [provider]  sertraline (ZOLOFT) 50 MG tablet Take 50 mg by mouth daily. 01/22/20   [provider]    Family History Family History  Problem Relation Age of Onset   Hypertension Mother    Diabetes Father    Hypertension Father    Diabetes Maternal Grandmother    Heart disease Maternal Grandmother    Hypertension Maternal Grandmother    Kidney disease Maternal Grandmother    Hypertension Maternal Grandfather    Diabetes Paternal Grandmother    Cancer Paternal Grandfather    Breast cancer Maternal Aunt     Social History Social History   Tobacco Use   Smoking status: Never   Smokeless tobacco: Never  Substance Use Topics   Alcohol use: Yes    Comment: occ   Drug use: No     Allergies   Patient has no known allergies.   Review of Systems Review of Systems Per HPI  Physical Exam Triage Vital Signs ED Triage Vitals  Enc Vitals Group     BP 03/04/23 1743 (!) 165/80     Pulse Rate 03/04/23 1743 (!) 124  Resp 03/04/23 1743 20     Temp 03/04/23 1743 99.4 F (37.4 C)     Temp Source 03/04/23 1743 Oral     SpO2 03/04/23 1743 96 %     Weight --      Height --      Head Circumference --      Peak Flow --      Pain Score 03/04/23 1744 9     Pain Loc --      Pain Edu? --      Excl. in GC? --    No data found.  Updated Vital Signs BP (!) 165/80 (BP Location: Right Arm)   Pulse (!) 124   Temp 99.4 F (37.4 C) (Oral)   Resp 20   SpO2 96%   Breastfeeding No   Visual Acuity Right Eye Distance:   Left Eye Distance:   Bilateral Distance:    Right Eye Near:   Left Eye Near:    Bilateral Near:     Physical Exam Vitals and nursing note reviewed.  Constitutional:      General: She is not in acute distress.    Appearance: Normal appearance.  HENT:     Head: Normocephalic.     Right Ear: Tympanic membrane, ear canal and external ear normal.     Left Ear: Tympanic membrane, ear canal and external ear  normal.     Nose: Nose normal.     Right Turbinates: Enlarged and swollen.     Left Turbinates: Enlarged and swollen.     Right Sinus: No maxillary sinus tenderness or frontal sinus tenderness.     Left Sinus: No maxillary sinus tenderness or frontal sinus tenderness.     Mouth/Throat:     Lips: Pink.     Mouth: Mucous membranes are moist.     Pharynx: Oropharynx is clear. Uvula midline. No pharyngeal swelling or posterior oropharyngeal erythema.  Eyes:     Extraocular Movements: Extraocular movements intact.     Conjunctiva/sclera: Conjunctivae normal.     Pupils: Pupils are equal, round, and reactive to light.  Cardiovascular:     Rate and Rhythm: Regular rhythm. Tachycardia present.     Pulses: Normal pulses.     Heart sounds: Normal heart sounds.  Pulmonary:     Effort: Pulmonary effort is normal. No respiratory distress.     Breath sounds: Normal breath sounds. No stridor. No wheezing, rhonchi or rales.  Abdominal:     General: Bowel sounds are normal.     Palpations: Abdomen is soft.     Tenderness: There is no abdominal tenderness.  Musculoskeletal:     Cervical back: Normal range of motion.  Lymphadenopathy:     Cervical: No cervical adenopathy.  Skin:    General: Skin is warm and dry.  Neurological:     General: No focal deficit present.     Mental Status: She is alert and oriented to person, place, and time.  Psychiatric:        Mood and Affect: Mood normal.        Behavior: Behavior normal.      UC Treatments / Results  Labs (all labs ordered are listed, but only abnormal results are displayed) Labs Reviewed  SARS CORONAVIRUS 2 (TAT 6-24 HRS)  POCT RAPID STREP A (OFFICE)  POCT INFLUENZA A/B    EKG   Radiology No results found.  Procedures Procedures (including critical care time)  Medications Ordered in UC Medications - No data to display  Initial Impression / Assessment and Plan / UC Course  I have reviewed the triage vital signs and the  nursing notes.  Pertinent labs & imaging results that were available during my care of the patient were reviewed by me and considered in my medical decision making (see chart for details).  The patient is well-appearing, she is in no acute distress, she is hypertensive and tachycardic.  Rapid strep test and influenza test were negative.  COVID test is pending.  Patient is a candidate to receive molnupiravir if her COVID test is positive.  Supportive care recommendations were provided and discussed with the patient to include continuing Tylenol for pain, fever, general discomfort, increasing fluids, and allowing for plenty of rest.  Discussed viral etiology with the patient and when follow-up may be indicated.  Patient is in agreement with this plan of care and verbalizes understanding.  All questions were answered.  Patient stable for discharge.  Work note was provided.  Final Clinical Impressions(s) / UC Diagnoses   Final diagnoses:  Viral illness  Encounter for screening for COVID-19     Discharge Instructions      The rapid strep test and influenza test were negative.  A COVID test is pending.  You will be contacted if being pending COVID test is positive. Continue Tylenol as needed for pain, fever, or general discomfort. Increase fluids and allow for plenty of rest.  You should be drinking at least 8-10 8 ounce glasses of water while symptoms persist. Recommend a hot shower or soak in a warm Epsom salt bath to help with bodyaches. Symptoms appear to be caused by a virus.  A virus can last from 7 to 14 days.  If symptoms suddenly worsen before that time, or extend beyond that time, please follow-up in this clinic or with your primary care physician for further evaluation. Follow-up as needed.     ED Prescriptions   None    PDMP not reviewed this encounter.   Abran Cantor, NP 03/04/23 [phone]

## 2023-03-04 NOTE — ED Triage Notes (Signed)
Pt reports woke up with generalized body aches, fever, chills, headache. Reports last took tylenol PTA.

## 2023-03-04 NOTE — Discharge Instructions (Addendum)
The rapid strep test and influenza test were negative.  A COVID test is pending.  You will be contacted if being pending COVID test is positive. Continue Tylenol as needed for pain, fever, or general discomfort. Increase fluids and allow for plenty of rest.  You should be drinking at least 8-10 8 ounce glasses of water while symptoms persist. Recommend a hot shower or soak in a warm Epsom salt bath to help with bodyaches. Symptoms appear to be caused by a virus.  A virus can last from 7 to 14 days.  If symptoms suddenly worsen before that time, or extend beyond that time, please follow-up in this clinic or with your primary care physician for further evaluation. Follow-up as needed.

## 2023-03-05 LAB — SARS CORONAVIRUS 2 (TAT 6-24 HRS): SARS Coronavirus 2: NEGATIVE

## 2023-03-29 ENCOUNTER — Encounter: Payer: Self-pay | Admitting: Obstetrics & Gynecology

## 2023-03-29 ENCOUNTER — Other Ambulatory Visit (HOSPITAL_COMMUNITY)
Admission: RE | Admit: 2023-03-29 | Payer: BC Managed Care – PPO | Source: Ambulatory Visit | Admitting: Obstetrics & Gynecology

## 2023-03-29 ENCOUNTER — Ambulatory Visit (INDEPENDENT_AMBULATORY_CARE_PROVIDER_SITE_OTHER): Payer: BC Managed Care – PPO | Admitting: Obstetrics & Gynecology

## 2023-03-29 VITALS — BP 136/91 | HR 90 | Ht 65.0 in | Wt 280.0 lb

## 2023-03-29 DIAGNOSIS — Z01419 Encounter for gynecological examination (general) (routine) without abnormal findings: Secondary | ICD-10-CM | POA: Diagnosis not present

## 2023-03-29 DIAGNOSIS — Z113 Encounter for screening for infections with a predominantly sexual mode of transmission: Secondary | ICD-10-CM

## 2023-03-29 NOTE — Progress Notes (Signed)
Subjective:     Briana Nichols is a 37 y.o. female here for a routine exam.  No LMP recorded. Patient has had an injection. [id number] Birth Control Method:  depo provera  Menstrual Calendar(currently): amenorrheic   Current complaints: hormones.   Current acute medical issues:  none   Recent Gynecologic History No LMP recorded. Patient has had an injection. Last Pap: 2019,  normal Last mammogram: age38,    Past Medical History:  Diagnosis Date   BV (bacterial vaginosis)    Gestational diabetes    with first pregnancy   Heart palpitations    Hx of chlamydia infection    Other and unspecified ovarian cyst 12/03/2013   Vaginal Pap smear, abnormal     Past Surgical History:  Procedure Laterality Date   CERVICAL CERCLAGE  06/08/2012   Procedure: CERCLAGE CERVICAL;  Surgeon: Tilda Burrow, MD;  Location: [facility];  Service: Gynecology;  Laterality: N/A;   CESAREAN SECTION  2011    OB History     Gravida  4   Para  2   Term  1   Preterm  1   AB  1   Living  2      SAB  1   IAB      Ectopic      Multiple      Live Births  1           Social History   Socioeconomic History   Marital status: Single    Spouse name: Not on file   Number of children: 2   Years of education: Not on file   Highest education level: Not on file  Occupational History   Not on file  Tobacco Use   Smoking status: Never   Smokeless tobacco: Never  Substance and Sexual Activity   Alcohol use: Yes    Comment: occ   Drug use: No   Sexual activity: Not Currently    Partners: Male    Birth control/protection: Injection  Other Topics Concern   Not on file  Social History Narrative   Not on file   Social Determinants of Health   Financial Resource Strain: Not on file  Food Insecurity: Not on file  Transportation Needs: Not on file  Physical Activity: Not on file  Stress: Not on file  Social Connections: Not on file    Family History  Problem Relation Age of Onset    Hypertension Mother    Diabetes Father    Hypertension Father    Diabetes Maternal Grandmother    Heart disease Maternal Grandmother    Hypertension Maternal Grandmother    Kidney disease Maternal Grandmother    Hypertension Maternal Grandfather    Diabetes Paternal Grandmother    Cancer Paternal Grandfather    Breast cancer Maternal Aunt      Current Outpatient Medications:    atorvastatin (LIPITOR) 40 MG tablet, Take 40 mg by mouth daily., Disp: , Rfl:    diclofenac (VOLTAREN) 50 MG EC tablet, Take 50 mg by mouth 2 (two) times daily., Disp: , Rfl:    medroxyPROGESTERone (DEPO-PROVERA) 150 MG/ML injection, Inject 1 mL (150 mg total) into the muscle every 3 (three) months., Disp: 1 mL, Rfl: 3   sertraline (ZOLOFT) 50 MG tablet, Take 50 mg by mouth daily., Disp: , Rfl:    Multiple Vitamins-Minerals (WOMENS MULTI VITAMIN & MINERAL PO), Take 3 tablets by mouth daily. (Patient not taking: Reported on 03/29/2023), Disp: , Rfl:   Review of  Systems  Review of Systems  Constitutional: Negative for fever, chills, weight loss, malaise/fatigue and diaphoresis.  HENT: Negative for hearing loss, ear pain, nosebleeds, congestion, sore throat, neck pain, tinnitus and ear discharge.   Eyes: Negative for blurred vision, double vision, photophobia, pain, discharge and redness.  Respiratory: Negative for cough, hemoptysis, sputum production, shortness of breath, wheezing and stridor.   Cardiovascular: Negative for chest pain, palpitations, orthopnea, claudication, leg swelling and PND.  Gastrointestinal: negative for abdominal pain. Negative for heartburn, nausea, vomiting, diarrhea, constipation, blood in stool and melena.  Genitourinary: Negative for dysuria, urgency, frequency, hematuria and flank pain.  Musculoskeletal: Negative for myalgias, back pain, joint pain and falls.  Skin: Negative for itching and rash.  Neurological: Negative for dizziness, tingling, tremors, sensory change, speech change,  focal weakness, seizures, loss of consciousness, weakness and headaches.  Endo/Heme/Allergies: Negative for environmental allergies and polydipsia. Does not bruise/bleed easily.  Psychiatric/Behavioral: Negative for depression, suicidal ideas, hallucinations, memory loss and substance abuse. The patient is not nervous/anxious and does not have insomnia.        Objective:  Blood pressure (!) 136/91, pulse 90, height 5\' 5"  (1.651 m), weight 280 lb (127 kg).   Physical Exam  Vitals reviewed. Constitutional: She is oriented to person, place, and time. She appears well-developed and well-nourished.  HENT:  Head: Normocephalic and atraumatic.        Right Ear: External ear normal.  Left Ear: External ear normal.  Nose: Nose normal.  Mouth/Throat: Oropharynx is clear and moist.  Eyes: Conjunctivae and EOM are normal. Pupils are equal, round, and reactive to light. Right eye exhibits no discharge. Left eye exhibits no discharge. No scleral icterus.  Neck: Normal range of motion. Neck supple. No tracheal deviation present. No thyromegaly present.  Cardiovascular: Normal rate, regular rhythm, normal heart sounds and intact distal pulses.  Exam reveals no gallop and no friction rub.   No murmur heard. Respiratory: Effort normal and breath sounds normal. No respiratory distress. She has no wheezes. She has no rales. She exhibits no tenderness.  GI: Soft. Bowel sounds are normal. She exhibits no distension and no mass. There is no tenderness. There is no rebound and no guarding.  Genitourinary:  Breasts no masses skin changes or nipple changes bilaterally      Vulva is normal without lesions Vagina is pink moist without discharge Cervix normal in appearance and pap is done Uterus is normal size shape and contour Adnexa is negative with normal sized ovaries   Musculoskeletal: Normal range of motion. She exhibits no edema and no tenderness.  Neurological: She is alert and oriented to person, place,  and time. She has normal reflexes. She displays normal reflexes. No cranial nerve deficit. She exhibits normal muscle tone. Coordination normal.  Skin: Skin is warm and dry. No rash noted. No erythema. No pallor.  Psychiatric: She has a normal mood and affect. Her behavior is normal. Judgment and thought content normal.       Medications Ordered at today's visit: No orders of the defined types were placed in this encounter.   Other orders placed at today's visit: No orders of the defined types were placed in this encounter.     Assessment:    Normal Gyn exam.   Wants to get off depo-->wants BTL Plan:    Sign papers today and will schedule RA BS 05/18/23     No follow-ups on file.

## 2023-03-29 NOTE — Addendum Note (Signed)
Addended by: Annamarie Dawley on: 03/29/2023 03:13 PM   Modules accepted: Orders

## 2023-03-30 LAB — HEPATITIS C ANTIBODY: Hep C Virus Ab: NONREACTIVE

## 2023-03-30 LAB — HEPATITIS B SURFACE ANTIGEN: Hepatitis B Surface Ag: NEGATIVE

## 2023-03-30 LAB — HIV ANTIBODY (ROUTINE TESTING W REFLEX): HIV Screen 4th Generation wRfx: NONREACTIVE

## 2023-03-30 LAB — RPR: RPR Ser Ql: NONREACTIVE

## 2023-04-01 LAB — CYTOLOGY - PAP
Chlamydia: NEGATIVE
Comment: NEGATIVE
Comment: NEGATIVE
Comment: NEGATIVE
Comment: NORMAL
Diagnosis: NEGATIVE
High risk HPV: NEGATIVE
Neisseria Gonorrhea: NEGATIVE
Trichomonas: NEGATIVE

## 2023-04-05 ENCOUNTER — Encounter: Payer: Self-pay | Admitting: Obstetrics & Gynecology

## 2023-05-11 ENCOUNTER — Encounter: Payer: Self-pay | Admitting: Obstetrics & Gynecology

## 2023-05-12 NOTE — Patient Instructions (Signed)
Briana Nichols  05/12/2023     @[email or web address] @   Your procedure is scheduled on 05/17/23.  Report to [facility] at 0600 A.M.  Call this number if you have problems the morning of surgery:  [phone]  If you experience any cold or flu symptoms such as cough, fever, chills, shortness of breath, etc. between now and your scheduled surgery, please notify us at the above number.   Remember:  Do not eat or drink after midnight.      Take these medicines the morning of surgery with A SIP OF WATER zoloft    Do not wear jewelry, make-up or nail polish, including gel polish,  artificial nails, or any other type of covering on natural nails (fingers and  toes).  Do not wear lotions, powders, or perfumes, or deodorant.  Do not shave 48 hours prior to surgery.  Men may shave face and neck.  Do not bring valuables to the hospital.  [facility] is not responsible for any belongings or valuables.  Contacts, dentures or bridgework may not be worn into surgery.  Leave your suitcase in the car.  After surgery it may be brought to your room.  For patients admitted to the hospital, discharge time will be determined by your treatment team.  Patients discharged the day of surgery will not be allowed to drive home.   Name and phone number of your driver:   family Special instructions:    Please read over the following fact sheets that you were given. Anesthesia Post-op Instructions and Care and Recovery After Surgery      PATIENT INSTRUCTIONS POST-ANESTHESIA  IMMEDIATELY FOLLOWING SURGERY:  Do not drive or operate machinery for the first twenty four hours after surgery.  Do not make any important decisions for twenty four hours after surgery or while taking narcotic pain medications or sedatives.  If you develop intractable nausea and vomiting or a severe headache please notify your doctor immediately.  FOLLOW-UP:  Please make an appointment with your surgeon as instructed. You do  not need to follow up with anesthesia unless specifically instructed to do so.  WOUND CARE INSTRUCTIONS (if applicable):  Keep a dry clean dressing on the anesthesia/puncture wound site if there is drainage.  Once the wound has quit draining you may leave it open to air.  Generally you should leave the bandage intact for twenty four hours unless there is drainage.  If the epidural site drains for more than 36-48 hours please call the anesthesia department.  QUESTIONS?:  Please feel free to call your physician or the hospital operator if you have any questions, and they will be happy to assist you.      How to Use Chlorhexidine Before Surgery Chlorhexidine gluconate (CHG) is a germ-killing (antiseptic) solution that is used to clean the skin. It can get rid of the bacteria that normally live on the skin and can keep them away for about 24 hours. To clean your skin with CHG, you may be given: A CHG solution to use in the shower or as part of a sponge bath. A prepackaged cloth that contains CHG. Cleaning your skin with CHG may help lower the risk for infection: While you are staying in the intensive care unit of the hospital. If you have a vascular access, such as a central line, to provide short-term or long-term access to your veins. If you have a catheter to drain urine from your bladder. If you are on a ventilator. A ventilator  is a machine that helps you breathe by moving air in and out of your lungs. After surgery. What are the risks? Risks of using CHG include: A skin reaction. Hearing loss, if CHG gets in your ears and you have a perforated eardrum. Eye injury, if CHG gets in your eyes and is not rinsed out. The CHG product catching fire. Make sure that you avoid smoking and flames after applying CHG to your skin. Do not use CHG: If you have a chlorhexidine allergy or have previously reacted to chlorhexidine. On babies younger than 65 months of age. How to use CHG solution Use CHG  only as told by your health care provider, and follow the instructions on the label. Use the full amount of CHG as directed. Usually, this is one bottle. During a shower Follow these steps when using CHG solution during a shower (unless your health care provider gives you different instructions): Start the shower. Use your normal soap and shampoo to wash your face and hair. Turn off the shower or move out of the shower stream. Pour the CHG onto a clean washcloth. Do not use any type of brush or rough-edged sponge. Starting at your neck, lather your body down to your toes. Make sure you follow these instructions: If you will be having surgery, pay special attention to the part of your body where you will be having surgery. Scrub this area for at least 1 minute. Do not use CHG on your head or face. If the solution gets into your ears or eyes, rinse them well with water. Avoid your genital area. Avoid any areas of skin that have broken skin, cuts, or scrapes. Scrub your back and under your arms. Make sure to wash skin folds. Let the lather sit on your skin for 1-2 minutes or as long as told by your health care provider. Thoroughly rinse your entire body in the shower. Make sure that all body creases and crevices are rinsed well. Dry off with a clean towel. Do not put any substances on your body afterward--such as powder, lotion, or perfume--unless you are told to do so by your health care provider. Only use lotions that are recommended by the manufacturer. Put on clean clothes or pajamas. If it is the night before your surgery, sleep in clean sheets.  During a sponge bath Follow these steps when using CHG solution during a sponge bath (unless your health care provider gives you different instructions): Use your normal soap and shampoo to wash your face and hair. Pour the CHG onto a clean washcloth. Starting at your neck, lather your body down to your toes. Make sure you follow these  instructions: If you will be having surgery, pay special attention to the part of your body where you will be having surgery. Scrub this area for at least 1 minute. Do not use CHG on your head or face. If the solution gets into your ears or eyes, rinse them well with water. Avoid your genital area. Avoid any areas of skin that have broken skin, cuts, or scrapes. Scrub your back and under your arms. Make sure to wash skin folds. Let the lather sit on your skin for 1-2 minutes or as long as told by your health care provider. Using a different clean, wet washcloth, thoroughly rinse your entire body. Make sure that all body creases and crevices are rinsed well. Dry off with a clean towel. Do not put any substances on your body afterward--such as powder, lotion, or  perfume--unless you are told to do so by your health care provider. Only use lotions that are recommended by the manufacturer. Put on clean clothes or pajamas. If it is the night before your surgery, sleep in clean sheets. How to use CHG prepackaged cloths Only use CHG cloths as told by your health care provider, and follow the instructions on the label. Use the CHG cloth on clean, dry skin. Do not use the CHG cloth on your head or face unless your health care provider tells you to. When washing with the CHG cloth: Avoid your genital area. Avoid any areas of skin that have broken skin, cuts, or scrapes. Before surgery Follow these steps when using a CHG cloth to clean before surgery (unless your health care provider gives you different instructions): Using the CHG cloth, vigorously scrub the part of your body where you will be having surgery. Scrub using a back-and-forth motion for 3 minutes. The area on your body should be completely wet with CHG when you are done scrubbing. Do not rinse. Discard the cloth and let the area air-dry. Do not put any substances on the area afterward, such as powder, lotion, or perfume. Put on clean clothes  or pajamas. If it is the night before your surgery, sleep in clean sheets.  For general bathing Follow these steps when using CHG cloths for general bathing (unless your health care provider gives you different instructions). Use a separate CHG cloth for each area of your body. Make sure you wash between any folds of skin and between your fingers and toes. Wash your body in the following order, switching to a new cloth after each step: The front of your neck, shoulders, and chest. Both of your arms, under your arms, and your hands. Your stomach and groin area, avoiding the genitals. Your right leg and foot. Your left leg and foot. The back of your neck, your back, and your buttocks. Do not rinse. Discard the cloth and let the area air-dry. Do not put any substances on your body afterward--such as powder, lotion, or perfume--unless you are told to do so by your health care provider. Only use lotions that are recommended by the manufacturer. Put on clean clothes or pajamas. Contact a health care provider if: Your skin gets irritated after scrubbing. You have questions about using your solution or cloth. You swallow any chlorhexidine. Call your local poison control center [phone] in the [place] Get help right away if: Your eyes itch badly, or they become very red or swollen. Your skin itches badly and is red or swollen. Your hearing changes. You have trouble seeing. You have swelling or tingling in your mouth or throat. You have trouble breathing. These symptoms may represent a serious problem that is an emergency. Do not wait to see if the symptoms will go away. Get medical help right away. Call your local emergency services (911 in the [place] Do not drive yourself to the hospital. Summary Chlorhexidine gluconate (CHG) is a germ-killing (antiseptic) solution that is used to clean the skin. Cleaning your skin with CHG may help to lower your risk for infection. You may be given CHG to  use for bathing. It may be in a bottle or in a prepackaged cloth to use on your skin. Carefully follow your health care provider's instructions and the instructions on the product label. Do not use CHG if you have a chlorhexidine allergy. Contact your health care provider if your skin gets irritated after scrubbing. This information is  not intended to replace advice given to you by your health care provider. Make sure you discuss any questions you have with your health care provider. Document Revised: 01/11/2022 Document Reviewed: 11/24/2020 Elsevier Patient Education  2023 [employer] Salpingectomy Salpingectomy, also called tubectomy, is the surgical removal of one of the fallopian tubes. The fallopian tubes allow eggs to travel from the ovaries to the uterus. Removing one fallopian tube does not prevent pregnancy. It also does not cause problems with your menstrual periods. You may need this procedure if you: Have an ectopic pregnancy. This is when a fertilized egg attaches to the fallopian tube instead of the uterus. An ectopic pregnancy can cause the tube to burst or tear (rupture). Have an infected fallopian tube. Have cancer of the fallopian tube or nearby organs. Have had an ovary removed due to a cyst or tumor. Have had your uterus removed. Are at high risk for ovarian cancer. There are three different methods that can be used for a salpingectomy: An open method in which one large incision is made in your abdomen. A laparoscopic method in which a thin, lighted tube with a tiny camera (laparoscope) is used to help perform the procedure. The laparoscope allows a surgeon to make several small incisions in the abdomen instead of one large incision. A robot-assisted method in which a computer is used to control surgical instruments that are attached to robotic arms. Tell a health care provider about: Any allergies you have. All medicines you are taking, including vitamins, herbs, eye  drops, creams, and over-the-counter medicines. Any problems you or family members have had with anesthetic medicines. Any blood disorders you have. Any surgeries you have had. Any medical conditions you have. Whether you are pregnant or may be pregnant. What are the risks? Generally, this is a safe procedure. However, problems may occur, including: Infection. Bleeding. Allergic reactions to medicines. Blood clots in the legs or lungs. Damage to nearby structures or organs. What happens before the procedure? Staying hydrated Follow instructions from your health care provider about hydration, which may include: Up to 2 hours before the procedure - you may continue to drink clear liquids, such as water, clear fruit juice, black coffee, and plain tea. Eating and drinking restrictions Follow instructions from your health care provider about eating and drinking, which may include: 8 hours before the procedure - stop eating heavy meals or foods, such as meat, fried foods, or fatty foods. 6 hours before the procedure - stop eating light meals or foods, such as toast or cereal. 6 hours before the procedure - stop drinking milk or drinks that contain milk. 2 hours before the procedure - stop drinking clear liquids. Medicines Ask your health care provider about: Changing or stopping your regular medicines. This is especially important if you are taking diabetes medicines or blood thinners. Taking medicines such as aspirin and ibuprofen. These medicines can thin your blood. Do not take these medicines unless your health care provider tells you to take them. Taking over-the-counter medicines, vitamins, herbs, and supplements. General instructions Do not use any products that contain nicotine or tobacco for at least 4 weeks before the procedure. These products include cigarettes, chewing tobacco, and vaping devices, such as e-cigarettes. If you need help quitting, ask your health care provider. You  may have an exam or tests, such as an electrocardiogram (ECG) or a blood or urine test. Ask your health care provider: How your surgery site will be marked. What steps will be taken to  help prevent infection. These steps may include: Removing hair at the surgery site. Washing skin with a germ-killing soap. Taking antibiotic medicine. Plan to have a responsible adult take you home from the hospital or clinic. If you will be going home right after the procedure, plan to have a responsible adult care for you for the time you are told. This is important. What happens during the procedure? An IV will be inserted into one of your veins. You will be given one or both of the following: A medicine to help you relax (sedative). A medicine to make you fall asleep (general anesthetic). A small, thin tube (catheter) may be inserted through your urethra and into your bladder. This will drain urine during your procedure. Depending on the type of procedure you are having, one incision or several small incisions will be made in your abdomen. Your fallopian tube and ovary will be cut away from the uterus and removed. Your blood vessels will be clamped and tied to prevent excess bleeding. The incision or incisions in your abdomen will be closed with stitches (sutures), staples, or skin glue. A bandage (dressing) may be placed over your incision or incisions. The procedure may vary among health care providers and hospitals. What happens after the procedure?  Your blood pressure, heart rate, breathing rate, and blood oxygen level will be monitored until you leave the hospital or clinic. You may continue to receive fluids and medicines through an IV. You may continue to have a catheter draining your urine. You may have to wear compression stockings. These stockings help to prevent blood clots and reduce swelling in your legs. You will be given pain medicine as needed. If you were given a sedative during the  procedure, it can affect you for several hours. Do not drive or operate machinery until your health care provider says that it is safe. Summary Salpingectomy is a surgical procedure to remove one of the fallopian tubes. The procedure may be done with an open incision, a thin, lighted tube with a tiny camera (laparoscope), or computer-controlled instruments. Depending on the type of procedure you have, one incision or several small incisions will be made in your abdomen. Your blood pressure, heart rate, breathing rate, and blood oxygen level will be monitored until you leave the hospital or clinic. Plan to have a responsible adult take you home from the hospital or clinic. This information is not intended to replace advice given to you by your health care provider. Make sure you discuss any questions you have with your health care provider. Document Revised: 08/04/2020 Document Reviewed: 08/05/2020 Elsevier Patient Education  2024 [employer] Salpingectomy, Care After The following information offers guidance on how to care for yourself after your procedure. Your health care provider may also give you more specific instructions. If you have problems or questions, contact your health care provider. What can I expect after the procedure? After the procedure, it is common to have: Pain in your abdomen. Light vaginal bleeding (spotting) for a few days. Tiredness. Your recovery time will depend on which method was used for your surgery. Follow these instructions at home: Medicines Take over-the-counter and prescription medicines only as told by your health care provider. Ask your health care provider if the medicine prescribed to you: Requires you to avoid driving or using machinery. Can cause constipation. You may need to take actions to prevent or treat constipation, such as: Drink enough fluid to keep your urine pale yellow. Take over-the-counter or prescription medicines.  Eat foods that  are high in fiber, such as beans, whole grains, and fresh fruits and vegetables. Limit foods that are high in fat and processed sugars, such as fried or sweet foods. Incision care  Follow instructions from your health care provider about how to take care of your incision or incisions. Make sure you: Wash your hands with soap and water for at least 20 seconds before and after you change your bandage (dressing). If soap and water are not available, use hand sanitizer. Change or remove your dressing as told by your health care provider. Leave stitches (sutures), skin glue, staples, or adhesive strips in place. These skin closures may need to stay in place for 2 weeks or longer. If adhesive strip edges start to loosen and curl up, you may trim the loose edges. Do not remove adhesive strips completely unless your health care provider tells you to do that. Keep your dressing clean and dry. Check your incision area every day for signs of infection. Check for: Redness, swelling, or pain that gets worse. Fluid or blood. Warmth. Pus or a bad smell. Activity Rest as told by your health care provider. Avoid sitting for a long time without moving. Get up to take short walks every 1-2 hours. This is important to improve blood flow and breathing. Ask for help if you feel weak or unsteady. Return to your normal activities as told by your health care provider. Ask your health care provider what activities are safe for you. Do not drive until your health care provider says that it is safe. Do not lift anything that is heavier than 10 lb (4.5 kg), or the limit that you are told, until your health care provider says that it is safe. This may last for 2-6 weeks depending on your surgery. Do not douche, use tampons, or have sex until your health care provider approves. General instructions Do not use any products that contain nicotine or tobacco. These products include cigarettes, chewing tobacco, and vaping  devices, such as e-cigarettes. These can delay healing after surgery. If you need help quitting, ask your health care provider. Wear compression stockings as told by your health care provider. These stockings help to prevent blood clots and reduce swelling in your legs. Do not take baths, swim, or use a hot tub until your health care provider approves. You may take showers. Keep all follow-up visits. This is important. Contact a health care provider if: You have pain when you urinate. You have redness, swelling, or more pain around an incision or an incision feels warm to the touch. You have pus, fluid, blood, or a bad smell coming from an incision or an incision starts to open. You have a fever. You have abdominal pain that gets worse or does not get better with medicine. You have a rash. You feel light-headed, have nausea and vomiting, or both. Get help right away if: You have pain in your chest or leg. You develop shortness of breath. You faint. You have increased or heavy vaginal bleeding, such as soaking a sanitary napkin in an hour. These symptoms may represent a serious problem that is an emergency. Do not wait to see if the symptoms will go away. Get medical help right away. Call your local emergency services (911 in the [place] Do not drive yourself to the hospital. Summary After the procedure, it is common to feel tired, have pain in your abdomen, and have light vaginal bleeding for a few days. Follow instructions from  your health care provider about how to take care of your incision or incisions. Return to your normal activities as told by your health care provider. Ask your health care provider what activities are safe for you. Do not douche, use tampons, or have sex until your health care provider approves. Keep all follow-up visits. This is important. This information is not intended to replace advice given to you by your health care provider. Make sure you discuss any questions  you have with your health care provider. Document Revised: 08/04/2020 Document Reviewed: 08/05/2020 Elsevier Patient Education  2024 [employer].

## 2023-05-13 NOTE — H&P (Signed)
[facility] Gynecology Attending History and Physical  Briana Nichols is a 37 y.o. [phone] who presents for scheduled robotic assisted laparoscopic bilateral salpingectomy  In review, she is currently using Depot for contraception; however, she desires permanent sterilization as she has completed child-bearing.  She currently does not have a period with Depot.  Denies any abnormal vaginal discharge, fevers, chills, sweats, dysuria, nausea, vomiting, other GI or GU symptoms or other general symptoms.  No other acute complaints or concerns.  Past Medical History:  Diagnosis Date   BV (bacterial vaginosis)    Gestational diabetes    with first pregnancy   Heart palpitations    Hx of chlamydia infection    Other and unspecified ovarian cyst 12/03/2013   Vaginal Pap smear, abnormal    Past Surgical History:  Procedure Laterality Date   CERVICAL CERCLAGE  06/08/2012   Procedure: CERCLAGE CERVICAL;  Surgeon: Tilda Burrow, MD;  Location: [facility];  Service: Gynecology;  Laterality: N/A;   CESAREAN SECTION  2011   OB History  Gravida Para Term Preterm AB Living  4 2 1 1 1 2   SAB IAB Ectopic Multiple Live Births  1       1    # Outcome Date GA Lbr Len/2nd Weight Sex Type Anes PTL Lv  4 Preterm 08/30/12 [redacted]w[redacted]d 02:37 / 00:12 2171 g F VBAC EPI  LIV     Birth Comments: Mom 54 y/o G3P1, Diabetic, Incompetent cervix with cerclage. Got Dexa x 2.  3 Term 03/29/10 [redacted]w[redacted]d 12:00    EPI       Birth Comments: cpd a c/c/  2 SAB 09/27/06 [redacted]w[redacted]d         1 Gravida              Birth Comments: System Generated. Please review and update pregnancy details.  Patient denies any other pertinent gynecologic issues.  No current facility-administered medications on file prior to encounter.   Current Outpatient Medications on File Prior to Encounter  Medication Sig Dispense Refill   atorvastatin (LIPITOR) 40 MG tablet Take 40 mg by mouth daily.     diclofenac (VOLTAREN) 50 MG EC tablet Take 50  mg by mouth 2 (two) times daily.     medroxyPROGESTERone (DEPO-PROVERA) 150 MG/ML injection Inject 1 mL (150 mg total) into the muscle every 3 (three) months. 1 mL 3   Multiple Vitamins-Minerals (WOMENS MULTI VITAMIN & MINERAL PO) Take 3 tablets by mouth daily. (Patient not taking: Reported on 03/29/2023)     sertraline (ZOLOFT) 50 MG tablet Take 50 mg by mouth daily.     No Known Allergies  Social History:   reports that she has never smoked. She has never used smokeless tobacco. She reports current alcohol use. She reports that she does not use drugs. Family History  Problem Relation Age of Onset   Hypertension Mother    Diabetes Father    Hypertension Father    Diabetes Maternal Grandmother    Heart disease Maternal Grandmother    Hypertension Maternal Grandmother    Kidney disease Maternal Grandmother    Hypertension Maternal Grandfather    Diabetes Paternal Grandmother    Cancer Paternal Grandfather    Breast cancer Maternal Aunt     Review of Systems: Pertinent items noted in HPI and remainder of comprehensive ROS otherwise negative.  PHYSICAL EXAM: BP (!) 154/94   Pulse 90   Temp 98.5 F (36.9 C) (Oral)   Resp (!) 27  Ht 5\' 5"  (1.651 m)   Wt 65.3 kg   SpO2 100%   BMI 23.96 kg/m   CONSTITUTIONAL: Well-developed, well-nourished female in no acute distress.  SKIN: Skin is warm and dry. No rash noted. Not diaphoretic. No erythema. No pallor. NEUROLOGIC: Alert and oriented to person, place, and time. Normal reflexes, muscle tone coordination. No cranial nerve deficit noted. PSYCHIATRIC: Normal mood and affect. Normal behavior. Normal judgment and thought content. CARDIOVASCULAR: Normal heart rate noted, regular rhythm RESPIRATORY: Effort and breath sounds normal, no problems with respiration noted ABDOMEN: Obese, soft, nontender, nondistended. PELVIC: deferred MUSCULOSKELETAL: no calf tenderness bilaterally EXT: no edema bilaterally, normal  pulses  Assessment: Desires sterilization  Plan: Robotic-assisted laparoscopic bilateral salpingectomy -IV Toradol -NPO -LR @ 125cc/hr -SCDs to OR -Risk/benefits and alternatives reviewed with the patient including but not limited to risk of bleeding, infection and injury to surrounding organs.  Questions and concerns were addressed and pt desires to proceed  Myna Hidalgo, DO Attending Obstetrician & Gynecologist, [facility] for [facility], [facility]

## 2023-05-16 ENCOUNTER — Encounter (HOSPITAL_COMMUNITY): Payer: Self-pay

## 2023-05-16 ENCOUNTER — Other Ambulatory Visit: Payer: Self-pay

## 2023-05-16 ENCOUNTER — Encounter (HOSPITAL_COMMUNITY)
Admission: RE | Admit: 2023-05-16 | Discharge: 2023-05-16 | Disposition: A | Discharge status: Home / Self Care | Payer: BC Managed Care – PPO | Source: Ambulatory Visit | Attending: Obstetrics & Gynecology | Admitting: Obstetrics & Gynecology

## 2023-05-16 DIAGNOSIS — Z01810 Encounter for preprocedural cardiovascular examination: Secondary | ICD-10-CM | POA: Insufficient documentation

## 2023-05-16 DIAGNOSIS — Z01812 Encounter for preprocedural laboratory examination: Secondary | ICD-10-CM | POA: Diagnosis not present

## 2023-05-16 DIAGNOSIS — Z302 Encounter for sterilization: Secondary | ICD-10-CM

## 2023-05-16 DIAGNOSIS — Z01818 Encounter for other preprocedural examination: Secondary | ICD-10-CM | POA: Diagnosis present

## 2023-05-16 LAB — BASIC METABOLIC PANEL
Anion gap: 7 (ref 5–15)
BUN: 15 mg/dL (ref 6–20)
CO2: 23 mmol/L (ref 22–32)
Calcium: 8.8 mg/dL — ABNORMAL LOW (ref 8.9–10.3)
Chloride: 106 mmol/L (ref 98–111)
Creatinine, Ser: 0.91 mg/dL (ref 0.44–1.00)
GFR, Estimated: >60 mL/min (ref >60)
Glucose, Bld: 99 mg/dL (ref 70–99)
Potassium: 4 mmol/L (ref 3.5–5.1)
Sodium: 136 mmol/L (ref 135–145)

## 2023-05-16 LAB — CBC
HCT: 39.3 % (ref 36.0–46.0)
Hemoglobin: 13.1 g/dL (ref 12.0–15.0)
MCH: 30.8 pg (ref 26.0–34.0)
MCHC: 33.3 g/dL (ref 30.0–36.0)
MCV: 92.5 fL (ref 80.0–100.0)
Platelets: 206 10*3/uL (ref 150–400)
RBC: 4.25 MIL/uL (ref 3.87–5.11)
RDW: 12.5 % (ref 11.5–15.5)
WBC: 5.6 10*3/uL (ref 4.0–10.5)
nRBC: 0 % (ref 0.0–0.2)

## 2023-05-16 LAB — TYPE AND SCREEN
ABO/RH(D): A POS
Antibody Screen: NEGATIVE

## 2023-05-16 LAB — PREGNANCY, URINE: Preg Test, Ur: NEGATIVE

## 2023-05-17 ENCOUNTER — Ambulatory Visit (HOSPITAL_COMMUNITY)
Admission: RE | Admit: 2023-05-17 | Discharge: 2023-05-17 | Disposition: A | Discharge status: Home / Self Care | Payer: BC Managed Care – PPO | Source: Ambulatory Visit | Attending: Obstetrics & Gynecology | Admitting: Obstetrics & Gynecology

## 2023-05-17 ENCOUNTER — Ambulatory Visit (HOSPITAL_COMMUNITY): Payer: BC Managed Care – PPO | Admitting: Certified Registered"

## 2023-05-17 ENCOUNTER — Encounter (HOSPITAL_COMMUNITY)
Admission: RE | Disposition: A | Discharge status: Home / Self Care | Payer: Self-pay | Source: Ambulatory Visit | Attending: Obstetrics & Gynecology

## 2023-05-17 ENCOUNTER — Encounter (HOSPITAL_COMMUNITY): Payer: Self-pay | Admitting: Obstetrics & Gynecology

## 2023-05-17 ENCOUNTER — Ambulatory Visit (HOSPITAL_COMMUNITY): Payer: Self-pay | Admitting: Certified Registered"

## 2023-05-17 DIAGNOSIS — Z793 Long term (current) use of hormonal contraceptives: Secondary | ICD-10-CM | POA: Diagnosis not present

## 2023-05-17 DIAGNOSIS — Z302 Encounter for sterilization: Secondary | ICD-10-CM | POA: Insufficient documentation

## 2023-05-17 HISTORY — PX: XI ROBOTIC ASSISTED SALPINGECTOMY: SHX6824

## 2023-05-17 LAB — GLUCOSE, CAPILLARY: Glucose-Capillary: 95 mg/dL (ref 70–99)

## 2023-05-17 SURGERY — SALPINGECTOMY, ROBOT-ASSISTED
Anesthesia: General | Site: Abdomen | Laterality: Bilateral

## 2023-05-17 MED ORDER — DEXAMETHASONE SODIUM PHOSPHATE 10 MG/ML IJ SOLN
INTRAMUSCULAR | Status: AC
  Filled 2023-05-17: qty 1

## 2023-05-17 MED ORDER — DEXMEDETOMIDINE HCL IN NACL 80 MCG/20ML IV SOLN
INTRAVENOUS | Status: AC
  Filled 2023-05-17: qty 20

## 2023-05-17 MED ORDER — DEXMEDETOMIDINE HCL IN NACL 80 MCG/20ML IV SOLN
INTRAVENOUS | Status: DC | PRN
  Administered 2023-05-17: 8 ug via INTRAVENOUS

## 2023-05-17 MED ORDER — DEXAMETHASONE SODIUM PHOSPHATE 10 MG/ML IJ SOLN
INTRAMUSCULAR | Status: DC | PRN
  Administered 2023-05-17: 5 mg via INTRAVENOUS

## 2023-05-17 MED ORDER — ESMOLOL HCL 100 MG/10ML IV SOLN
INTRAVENOUS | Status: DC | PRN
  Administered 2023-05-17: 20 mg via INTRAVENOUS

## 2023-05-17 MED ORDER — ONDANSETRON HCL 4 MG/2ML IJ SOLN: 4.0000 mg | Freq: Once | INTRAMUSCULAR | Status: DC | PRN

## 2023-05-17 MED ORDER — PROPOFOL 10 MG/ML IV BOLUS
INTRAVENOUS | Status: AC
  Filled 2023-05-17: qty 20

## 2023-05-17 MED ORDER — KETOROLAC TROMETHAMINE 30 MG/ML IJ SOLN
INTRAMUSCULAR | Status: DC | PRN
  Administered 2023-05-17: 30 mg via INTRAVENOUS

## 2023-05-17 MED ORDER — LACTATED RINGERS IV SOLN: INTRAVENOUS | Status: DC

## 2023-05-17 MED ORDER — ONDANSETRON HCL 4 MG/2ML IJ SOLN
INTRAMUSCULAR | Status: DC | PRN
Start: 2023-05-17 — End: 2023-05-17
  Administered 2023-05-17: 4 mg via INTRAVENOUS

## 2023-05-17 MED ORDER — SCOPOLAMINE 1 MG/3DAYS TD PT72
MEDICATED_PATCH | TRANSDERMAL | Status: AC
  Filled 2023-05-17: qty 1

## 2023-05-17 MED ORDER — SCOPOLAMINE 1 MG/3DAYS TD PT72
MEDICATED_PATCH | TRANSDERMAL | Status: DC | PRN
  Administered 2023-05-17: 1 via TRANSDERMAL

## 2023-05-17 MED ORDER — ROCURONIUM BROMIDE 100 MG/10ML IV SOLN
INTRAVENOUS | Status: DC | PRN
  Administered 2023-05-17: 50 mg via INTRAVENOUS

## 2023-05-17 MED ORDER — KETOROLAC TROMETHAMINE 30 MG/ML IJ SOLN
INTRAMUSCULAR | Status: AC
  Filled 2023-05-17: qty 1

## 2023-05-17 MED ORDER — FENTANYL CITRATE (PF) 100 MCG/2ML IJ SOLN
INTRAMUSCULAR | Status: AC
  Filled 2023-05-17: qty 2

## 2023-05-17 MED ORDER — OXYCODONE HCL 5 MG/5ML PO SOLN: 5.0000 mg | Freq: Once | ORAL | Status: DC | PRN

## 2023-05-17 MED ORDER — PROPOFOL 10 MG/ML IV BOLUS
INTRAVENOUS | Status: DC | PRN
Start: 2023-05-17 — End: 2023-05-17
  Administered 2023-05-17: 200 mg via INTRAVENOUS
  Administered 2023-05-17: 50 mg via INTRAVENOUS

## 2023-05-17 MED ORDER — LIDOCAINE 2% (20 MG/ML) 5 ML SYRINGE
INTRAMUSCULAR | Status: DC | PRN
  Administered 2023-05-17: 100 mg via INTRAVENOUS

## 2023-05-17 MED ORDER — OXYCODONE HCL 5 MG PO TABS: 5.0000 mg | ORAL_TABLET | Freq: Once | ORAL | Status: DC | PRN

## 2023-05-17 MED ORDER — ESMOLOL HCL 100 MG/10ML IV SOLN
INTRAVENOUS | Status: AC
  Filled 2023-05-17: qty 10

## 2023-05-17 MED ORDER — OXYCODONE HCL 5 MG PO TABS: 5.0000 mg | ORAL_TABLET | Freq: Four times a day (QID) | ORAL | 0 refills | Status: AC | PRN

## 2023-05-17 MED ORDER — ONDANSETRON HCL 4 MG/2ML IJ SOLN
INTRAMUSCULAR | Status: AC
  Filled 2023-05-17: qty 2

## 2023-05-17 MED ORDER — MIDAZOLAM HCL 2 MG/2ML IJ SOLN
INTRAMUSCULAR | Status: AC
  Filled 2023-05-17: qty 2

## 2023-05-17 MED ORDER — IBUPROFEN 600 MG PO TABS: 600.0000 mg | ORAL_TABLET | Freq: Four times a day (QID) | ORAL | 0 refills | Status: AC | PRN

## 2023-05-17 MED ORDER — LIDOCAINE HCL (PF) 2 % IJ SOLN
INTRAMUSCULAR | Status: AC
  Filled 2023-05-17: qty 5

## 2023-05-17 MED ORDER — FENTANYL CITRATE PF 50 MCG/ML IJ SOSY
25.0000 ug | PREFILLED_SYRINGE | INTRAMUSCULAR | Status: DC | PRN
  Administered 2023-05-17: 50 ug via INTRAVENOUS
  Filled 2023-05-17: qty 1

## 2023-05-17 MED ORDER — BUPIVACAINE HCL (PF) 0.25 % IJ SOLN
INTRAMUSCULAR | Status: AC
  Filled 2023-05-17: qty 60

## 2023-05-17 MED ORDER — MIDAZOLAM HCL 2 MG/2ML IJ SOLN
INTRAMUSCULAR | Status: DC | PRN
  Administered 2023-05-17: 2 mg via INTRAVENOUS

## 2023-05-17 MED ORDER — FENTANYL CITRATE (PF) 250 MCG/5ML IJ SOLN
INTRAMUSCULAR | Status: DC | PRN
  Administered 2023-05-17 (×4): 50 ug via INTRAVENOUS

## 2023-05-17 MED ORDER — STERILE WATER FOR IRRIGATION IR SOLN
Status: DC | PRN
  Administered 2023-05-17: 500 mL

## 2023-05-17 MED ORDER — ACETAMINOPHEN 325 MG PO TABS: 650.0000 mg | ORAL_TABLET | Freq: Four times a day (QID) | ORAL | Status: AC | PRN

## 2023-05-17 MED ORDER — DOCUSATE SODIUM 100 MG PO CAPS: 100.0000 mg | ORAL_CAPSULE | Freq: Two times a day (BID) | ORAL | 0 refills | Status: AC

## 2023-05-17 MED ORDER — PROPOFOL 500 MG/50ML IV EMUL
INTRAVENOUS | Status: DC | PRN
  Administered 2023-05-17: 25 ug/kg/min via INTRAVENOUS

## 2023-05-17 MED ORDER — SUGAMMADEX SODIUM 200 MG/2ML IV SOLN
INTRAVENOUS | Status: DC | PRN
  Administered 2023-05-17: 200 mg via INTRAVENOUS

## 2023-05-17 MED ORDER — ROCURONIUM BROMIDE 10 MG/ML (PF) SYRINGE
PREFILLED_SYRINGE | INTRAVENOUS | Status: AC
  Filled 2023-05-17: qty 10

## 2023-05-17 MED ORDER — BUPIVACAINE HCL (PF) 0.25 % IJ SOLN
INTRAMUSCULAR | Status: DC | PRN
  Administered 2023-05-17: 40 mL

## 2023-05-17 SURGICAL SUPPLY — 46 items
ADH SKN CLS APL DERMABOND .7 (GAUZE/BANDAGES/DRESSINGS) ×1
APL PRP STRL LF DISP 70% ISPRP (MISCELLANEOUS) ×1
APL SRG 38 LTWT LNG FL B (MISCELLANEOUS) ×1
APPLICATOR ARISTA FLEXITIP XL (MISCELLANEOUS) ×1 IMPLANT
BLADE SURG SZ11 CARB STEEL (BLADE) ×1 IMPLANT
CANNULA CAP OBTURATR AIRSEAL 8 (CAP) ×1 IMPLANT
CHLORAPREP W/TINT 26 (MISCELLANEOUS) ×1 IMPLANT
COVER MAYO STAND XLG (MISCELLANEOUS) ×1 IMPLANT
COVER TIP SHEARS 8 DVNC (MISCELLANEOUS) IMPLANT
DERMABOND ADVANCED .7 DNX12 (GAUZE/BANDAGES/DRESSINGS) ×1 IMPLANT
DILATOR CANAL MILEX (MISCELLANEOUS) ×1 IMPLANT
DRAPE ARM DVNC X/XI (DISPOSABLE) ×3 IMPLANT
DRAPE COLUMN DVNC XI (DISPOSABLE) ×1 IMPLANT
ELECT REM PT RETURN 9FT ADLT (ELECTROSURGICAL) ×1
ELECTRODE REM PT RTRN 9FT ADLT (ELECTROSURGICAL) ×1 IMPLANT
FORCEPS BPLR FENES DVNC XI (FORCEP) IMPLANT
GAUZE 4X4 16PLY ~~LOC~~+RFID DBL (SPONGE) ×2 IMPLANT
GLOVE BIO SURGEON STRL SZ 6.5 (GLOVE) ×3 IMPLANT
GLOVE BIOGEL PI IND STRL 7.0 (GLOVE) ×5 IMPLANT
GOWN STRL REUS W/ TWL LRG LVL3 (GOWN DISPOSABLE) ×2 IMPLANT
GOWN STRL REUS W/TWL LRG LVL3 (GOWN DISPOSABLE) ×3 IMPLANT
HEMOSTAT ARISTA ABSORB 3G PWDR (HEMOSTASIS) ×1 IMPLANT
KIT PINK PAD W/HEAD ARE REST (MISCELLANEOUS) ×1
KIT PINK PAD W/HEAD ARM REST (MISCELLANEOUS) ×1 IMPLANT
KIT TURNOVER CYSTO (KITS) ×1 IMPLANT
MANIFOLD NEPTUNE II (INSTRUMENTS) ×1 IMPLANT
NDL HYPO 25X1 1.5 SAFETY (NEEDLE) ×1 IMPLANT
NDL INSUFFLATION 14GA 120MM (NEEDLE) ×1 IMPLANT
NEEDLE HYPO 25X1 1.5 SAFETY (NEEDLE) ×1
NEEDLE INSUFFLATION 14GA 120MM (NEEDLE) ×1
OBTURATOR OPTICAL STND 8 DVNC (TROCAR) ×1
OBTURATOR OPTICALSTD 8 DVNC (TROCAR) ×1 IMPLANT
PACK PERI GYN (CUSTOM PROCEDURE TRAY) ×1 IMPLANT
POSITIONER HEAD 8X9X4 ADT (SOFTGOODS) ×1 IMPLANT
SEAL UNIV 5-12 XI (MISCELLANEOUS) ×3 IMPLANT
SEALER VESSEL EXT DVNC XI (MISCELLANEOUS) IMPLANT
SET BASIN LINEN APH (SET/KITS/TRAYS/PACK) ×1 IMPLANT
SET TUBE FILTERED XL AIRSEAL (SET/KITS/TRAYS/PACK) ×1 IMPLANT
SOL ANTI FOG 6CC (MISCELLANEOUS) ×1 IMPLANT
SUT MNCRL AB 4-0 PS2 18 (SUTURE) IMPLANT
SYR 10ML LL (SYRINGE) ×1 IMPLANT
SYR CONTROL 10ML LL (SYRINGE) ×1 IMPLANT
TAPE TRANSPORE STRL 2 31045 (GAUZE/BANDAGES/DRESSINGS) ×1 IMPLANT
TRAY FOLEY W/BAG SLVR 16FR (SET/KITS/TRAYS/PACK) ×1
TRAY FOLEY W/BAG SLVR 16FR ST (SET/KITS/TRAYS/PACK) IMPLANT
WATER STERILE IRR 500ML POUR (IV SOLUTION) ×1 IMPLANT

## 2023-05-17 NOTE — Op Note (Signed)
PREOPERATIVE DIAGNOSIS:  1) Desires sterilization POSTOPERATIVE DIAGNOSIS: same PROCEDURE PERFORMED: Robotic assisted- laparoscopic bilateral salpingectomy SURGEON: Dr. Myna Hidalgo ANESTHESIA: General endotracheal.  ESTIMATED BLOOD LOSS: 5 cc.  IV FLUIDS: 1300 cc of crystalloid.  UOP: 600cc SPECIMEN(S): bilateral fallopian tubes COMPLICATIONS: None.  CONDITION: Stable.   FINDINGS: No ascites or peritoneal studding was appreciated.  Grossly normal appearing bowel.  Uterus normal size and shape.  Ovaries and tubes were normal in appearance.     Informed consent was obtained from the patient prior to taking her to the operating room where anesthesia was found to be adequate. She was placed in dorsal lithotomy position and examined under anesthesia. She was prepped and draped in normal sterile fashion. The bladder was catheterized with a foley under sterile technique.  A bi-valve speculum was then placed and the anterior lip of the cervix was grasped with the single tooth tenaculum. The hulka uterine manipulator was then advanced into the uterus to provide uterine mobility. The speculum and tenaculum were then removed.   Attention was then turned to the patients abdomen where a 8 mm infraumbilical skin incision was made with the scalpel. The veress needle was carefully introduced into the peritoneal cavity while tenting the abdominal wall. Intraperitoneal placement was confirmed by use of a saline-drop test.  The gas was connected and confirmed intrabdominal placement by a low initial pressure of . The abdomen was then insuflated with CO2 gas. The trocar and sleeve were then advanced without difficulty into the abdomen under direct visualization. Intraabdominal placement was confirmed by the laparoscope and surveillance of the abdomen was performed. Grossly normal appearing abdomen as mentioned in findings above.  Two additional 7mm skin incision were made with placement of the trocar under  direct visualization. The patient was placed in Trendelenburg position and the [employer] robotic device was docked.  Next, attention was turned to the console where the salpingectomy was performed.      The left fallopian tube was grasped and using the vessel sealer was excised in its entirety.  A similar procedure was performed on the right with removal of the fallopian tube. Both tubes were removed through the port without complications. Excellent hemostasis was noted.  TAP block was completed using 0.25% Bupivacaine with 10cc injected in each of four locations.  The instruments were then removed from the patients abdomen with air allowed to fully escape. All ports were closed with monocryl and dermabond.  The manipulator was removed from the cervix with no lacerations or bleeding identified. Foley removed.  The patient tolerated the procedure well with all sponge, lap, and needle counts correct. The patient was taken to recovery in stable condition.  Myna Hidalgo, DO Attending Obstetrician & Gynecologist, [facility] for [employer], [facility]

## 2023-05-17 NOTE — Transfer of Care (Signed)
Immediate Anesthesia Transfer of Care Note  Patient: Briana Nichols  Procedure(s) Performed: XI ROBOTIC ASSISTED SALPINGECTOMY (Bilateral: Abdomen)  Patient Location: PACU  Anesthesia Type:General  Level of Consciousness: awake and alert   Airway & Oxygen Therapy: Patient Spontanous Breathing and Patient connected to face mask oxygen  Post-op Assessment: Report given to RN and Post -op Vital signs reviewed and stable  Post vital signs: Reviewed and stable  Last Vitals:  Vitals Value Taken Time  BP    Temp    Pulse    Resp    SpO2      Last Pain:  Vitals:   05/17/23 0640  TempSrc: Oral  PainSc: 0-No pain         Complications: No notable events documented.

## 2023-05-17 NOTE — Anesthesia Procedure Notes (Signed)
Procedure Name: Intubation Date/Time: 05/17/2023 7:45 AM  Performed by: Julian Reil, CRNAPre-anesthesia Checklist: Patient identified, Emergency Drugs available, Suction available and Patient being monitored Patient Re-evaluated:Patient Re-evaluated prior to induction Oxygen Delivery Method: Circle system utilized Preoxygenation: Pre-oxygenation with 100% oxygen Induction Type: IV induction Ventilation: Mask ventilation without difficulty Laryngoscope Size: Mac and 4 Grade View: Grade II Tube type: Oral Tube size: 7.5 mm Number of attempts: 1 Airway Equipment and Method: Stylet Placement Confirmation: ETT inserted through vocal cords under direct vision, positive ETCO2 and breath sounds checked- equal and bilateral Secured at: 23 cm Tube secured with: Tape Dental Injury: Teeth and Oropharynx as per pre-operative assessment  Comments: Very large tonsils noted on DL

## 2023-05-17 NOTE — Discharge Instructions (Signed)
HOME INSTRUCTIONS  Please note any unusual or excessive bleeding, pain, swelling. Mild dizziness or drowsiness are normal for about 24 hours after surgery.   Shower when comfortable  Restrictions: No driving for 24 hours or while taking pain medications.  Activity:  No heavy lifting (> 10 lbs), nothing in vagina (no tampons, douching, or intercourse) x 4 weeks; no tub baths for 4 weeks Vaginal spotting is expected but if your bleeding is heavy, period like,  please call the office   Incision: the bandaids will fall off when they are ready to; you may clean your incision with mild soap and water but do not rub or scrub the incision site.  You may experience slight bloody drainage from your incision periodically.  This is normal.  If you experience a large amount of drainage or the incision opens, please call your physician who will likely direct you to the emergency department.  Diet:  You may return to your regular diet.  Do not eat large meals.  Eat small frequent meals throughout the day.  Continue to drink a good amount of water at least 6-8 glasses of water per day, hydration is very important for the healing process.  Pain Management: Take Ibuprofen every 6 hours with food as needed for pain.  You can alternate this medication with over the counter tylenol.  For severe or breakthrough pain, a prescription of Oxycodone has been sent in.  Take this medication along with the tylenol.  -If the Oxycodone is too strong, just take over the counter tylenol instead.    Always take prescription pain medication with food.  Oxycodone may cause constipation, you may want to take a stool softener while taking this medication.  A prescription of colace has been sent in to take twice daily if needed while taking the oxycodone.  Be sure to drink plenty of fluids and increase your fiber to help with constipation.  Alcohol -- Avoid for 24 hours and while taking pain medications.  Nausea: Take sips of ginger  ale or soda  Fever -- Call physician if temperature over 101 degrees  Follow up:  If you do not already have a follow up appointment scheduled, please call the office at [phone].  If you experience fever (a temperature greater than 100.4), pain unrelieved by pain medication, shortness of breath, swelling of a single leg, or any other symptoms which are concerning to you please the office immediately.

## 2023-05-17 NOTE — Anesthesia Preprocedure Evaluation (Signed)
Anesthesia Evaluation  Patient identified by MRN, date of birth, ID band Patient awake    Reviewed: Allergy & Precautions, H&P , NPO status , Patient's Chart, lab work & pertinent test results, reviewed documented beta blocker date and time   Airway Mallampati: II  TM Distance: >3 FB Neck ROM: full    Dental no notable dental hx.    Pulmonary neg pulmonary ROS   Pulmonary exam normal breath sounds clear to auscultation       Cardiovascular Exercise Tolerance: Good negative cardio ROS  Rhythm:regular Rate:Normal     Neuro/Psych negative neurological ROS  negative psych ROS   GI/Hepatic negative GI ROS, Neg liver ROS,,,  Endo/Other  negative endocrine ROSdiabetes    Renal/GU negative Renal ROS  negative genitourinary   Musculoskeletal   Abdominal   Peds  Hematology negative hematology ROS (+)   Anesthesia Other Findings   Reproductive/Obstetrics negative OB ROS                             Anesthesia Physical Anesthesia Plan  ASA: 2  Anesthesia Plan: General and General ETT   Post-op Pain Management:    Induction:   PONV Risk Score and Plan: Ondansetron  Airway Management Planned:   Additional Equipment:   Intra-op Plan:   Post-operative Plan:   Informed Consent: I have reviewed the patients History and Physical, chart, labs and discussed the procedure including the risks, benefits and alternatives for the proposed anesthesia with the patient or authorized representative who has indicated his/her understanding and acceptance.     Dental Advisory Given  Plan Discussed with: CRNA  Anesthesia Plan Comments:        Anesthesia Quick Evaluation

## 2023-05-18 LAB — SURGICAL PATHOLOGY

## 2023-05-18 NOTE — Anesthesia Postprocedure Evaluation (Signed)
Anesthesia Post Note  Patient: Briana Nichols  Procedure(s) Performed: XI ROBOTIC ASSISTED SALPINGECTOMY (Bilateral: Abdomen)  Patient location during evaluation: Phase II Anesthesia Type: General Level of consciousness: awake Pain management: pain level controlled Vital Signs Assessment: post-procedure vital signs reviewed and stable Respiratory status: spontaneous breathing and respiratory function stable Cardiovascular status: blood pressure returned to baseline and stable Postop Assessment: no headache and no apparent nausea or vomiting Anesthetic complications: no Comments: Late entry   No notable events documented.   Last Vitals:  Vitals:   05/17/23 1015 05/17/23 1027  BP: 138/88 (!) 143/90  Pulse: 76 77  Resp: (!) 21 20  Temp:  36.8 C  SpO2: 100% 100%    Last Pain:  Vitals:   05/17/23 1027  TempSrc: Oral  PainSc: 4                  Windell Norfolk

## 2023-05-24 ENCOUNTER — Encounter: Payer: BC Managed Care – PPO | Admitting: Obstetrics & Gynecology

## 2023-05-25 ENCOUNTER — Encounter: Payer: Self-pay | Admitting: Obstetrics & Gynecology

## 2023-05-25 ENCOUNTER — Ambulatory Visit: Payer: BC Managed Care – PPO | Admitting: Obstetrics & Gynecology

## 2023-05-25 VITALS — BP 134/87 | HR 114 | Ht 65.0 in | Wt 277.0 lb

## 2023-05-25 DIAGNOSIS — Z4889 Encounter for other specified surgical aftercare: Secondary | ICD-10-CM

## 2023-05-25 NOTE — Progress Notes (Signed)
     PostOp Visit Note  Briana Nichols is a 37 y.o. [phone] female who presents for a postoperative visit. She is 1 week postop following a robotic-assisted bilateral salpingectomy  completed on 8/20   Today she notes she is doing well.  Reports no pain.  Had some light brown-mucus-like discharge, but that has since resolved.  Denies fever or chills.  Tolerating gen diet.  +Flatus, Regular BMs.  Overall doing well and reports no acute complaints   Review of Systems Pertinent items are noted in HPI.    Objective:  BP 134/87 (BP Location: Right Arm, Patient Position: Sitting, Cuff Size: Normal)   Pulse (!) 114   Ht 5\' 5"  (1.651 m)   Wt 277 lb (125.6 kg)   BMI 46.10 kg/m    Physical Examination:  GENERAL ASSESSMENT: well developed and well nourished SKIN: warm and dry CHEST: normal air exchange, respiratory effort normal with no retractions HEART: regular rate and rhythm ABDOMEN: soft, non-distended, obese, no rebound, no guarding INCISION: well healed- C/D/I EXTREMITY: no edema, no calf tenderness bilaterally PSYCH: mood appropriate, normal affect       Assessment:   S/p Robotic- assisted laparoscopic bilateral salpingectomy   Plan:   -meeting postop milestones appropriately -advised another 1-2 weeks recovery -f/u prn or 16yr for annual  Myna Hidalgo, DO Attending Obstetrician & Gynecologist, [facility] [employer], [facility]

## 2023-05-26 ENCOUNTER — Encounter (HOSPITAL_COMMUNITY): Payer: Self-pay | Admitting: Obstetrics & Gynecology
